# Patient Record
Sex: Female | Born: 1990 | Race: White | Hispanic: No | Marital: Single | Smoking: Former smoker
Health system: Southern US, Community
[De-identification: ages and names within clinical notes are randomized; demographics above are authoritative.]

## PROBLEM LIST (undated history)

## (undated) DIAGNOSIS — B977 Papillomavirus as the cause of diseases classified elsewhere: Secondary | ICD-10-CM

## (undated) DIAGNOSIS — F99 Mental disorder, not otherwise specified: Secondary | ICD-10-CM

## (undated) DIAGNOSIS — F1911 Other psychoactive substance abuse, in remission: Secondary | ICD-10-CM

## (undated) DIAGNOSIS — I780 Hereditary hemorrhagic telangiectasia: Secondary | ICD-10-CM

## (undated) DIAGNOSIS — R87613 High grade squamous intraepithelial lesion on cytologic smear of cervix (HGSIL): Secondary | ICD-10-CM

## (undated) DIAGNOSIS — D649 Anemia, unspecified: Secondary | ICD-10-CM

## (undated) DIAGNOSIS — T8859XA Other complications of anesthesia, initial encounter: Secondary | ICD-10-CM

## (undated) DIAGNOSIS — R040 Epistaxis: Secondary | ICD-10-CM

## (undated) DIAGNOSIS — Z8489 Family history of other specified conditions: Secondary | ICD-10-CM

## (undated) DIAGNOSIS — M4125 Other idiopathic scoliosis, thoracolumbar region: Secondary | ICD-10-CM

## (undated) DIAGNOSIS — D509 Iron deficiency anemia, unspecified: Secondary | ICD-10-CM

## (undated) DIAGNOSIS — Z8774 Personal history of (corrected) congenital malformations of heart and circulatory system: Secondary | ICD-10-CM

## (undated) DIAGNOSIS — F1010 Alcohol abuse, uncomplicated: Secondary | ICD-10-CM

## (undated) DIAGNOSIS — F4310 Post-traumatic stress disorder, unspecified: Secondary | ICD-10-CM

## (undated) DIAGNOSIS — F319 Bipolar disorder, unspecified: Secondary | ICD-10-CM

## (undated) DIAGNOSIS — Z8659 Personal history of other mental and behavioral disorders: Secondary | ICD-10-CM

## (undated) HISTORY — PX: TONSILLECTOMY: SUR1361

## (undated) HISTORY — PX: BACK SURGERY: SHX140

## (undated) HISTORY — PX: OTHER SURGICAL HISTORY: SHX169

## (undated) HISTORY — DX: Papillomavirus as the cause of diseases classified elsewhere: B97.7

## (undated) HISTORY — DX: Mental disorder, not otherwise specified: F99

---
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
## 2010-01-28 ENCOUNTER — Encounter: Payer: Self-pay | Admitting: Internal Medicine

## 2010-01-28 ENCOUNTER — Encounter: Payer: Self-pay | Admitting: Orthopaedic Surgery

## 2010-05-25 NOTE — Discharge Summary (Signed)
Wanda Anderson, Wanda Anderson                         ACCOUNT NO.:  [id number]   MEDICAL RECORD NO.:  [id number]                   PATIENT TYPE:  INP   LOCATION:  6114                                 FACILITY:  [facility]   PHYSICIAN:  Sharolyn Douglas, M.D.                     DATE OF BIRTH:  1990/01/26   DATE OF ADMISSION:  07/20/2003  DATE OF DISCHARGE:  07/25/2003                                 DISCHARGE SUMMARY   ADMISSION DIAGNOSES:  1. Idiopathic scoliosis.  2. Hereditary hemorrhagic telangiectasia syndrome, status post coil     procedure.   DISCHARGE DIAGNOSES:  1. Status post scoliosis fusion from T4 to L4.  2. Postoperative hemorrhagic anemic. It was stabilized symptomatically. It     did not require transfusion.  3. Postoperative temperature, resolved.  4. Idiopathic scoliosis.  5. Hereditary hemorrhagic telangiectasia syndrome, status post coil     procedure.   CONSULTATIONS:  Pediatric intensive care unit as well as pediatric  __________ with medical management postoperatively.   PROCEDURE:  On July 20, 2003, the patient was taken to the operating room  for a T4 to L4 posterior spinal fusion with __________ wires. This was done  by Dr. Sharolyn Douglas, assistant was [facility], PA-C. Anesthesia used was  general. One Hemovac drain was placed intraoperatively.   LABORATORY DATA:  Blood gases on July 20, 2003, on two separate occasions  showed pH of 7.461 and 7.453, PO2 504.0 and 513.0, bicarbonate 26.5 and  25.8, base excess of 3.0 and returned to normal at 2.0. Otherwise, normal.  CBC from July 18, 2003, with differential was normal with the exception of  hemoglobin of 15.2, hematocrit 44.3, MCHC 34.4. Postoperatively, CBC  continued to be monitored. She continued to have low hemoglobin and  hematocrit dropping to a low of 8.9 and 26.2 respectively. On July 23, 2003,  however she did not require any transfusion. White count was elevated on one  occasion on July 22, 2003, at 15.0, but  returned to normal the following day  at 11.2. Red cell count was also low throughout the hospital stay  postoperatively reaching levels of 3.01 on July 23, 2003.  MCHC was slightly  elevated throughout the hospital stay with a high being 34.8 on July 21, 2003. Platelet counts were low on both occasions reaching a low of 155 on  July 22, 2003. PT, INR, and PTT normal. Complete metabolic panel normal  preoperatively; postoperatively it was monitored for two days and she did  have slight hyponatremia on one occasion on July 22, 2003, at 134. Glucose  was slightly elevated at 108 and 114 respectively on July 14th and July  15th. Calcium was slightly decreased at 8.0 and 8.1. On July 18, 2003, ALT  was 232. UA from July 18, 2003, was negative. Blood type was July 18, 2003,  was type O positive, antibody screen negative.  X-rays from preoperative, two views of chest, from May 2005 showed scoliosis  and hyperinflation. On July 20, 2003, C-arm was intraoperatively for  localization. On July 25, 2003, x-rays showed thoracolumbar spine fusion  from T4 to L4.   BRIEF HISTORY:  The patient is a 20 year old female who Dr. Noel Gerold has been  following for idiopathic scoliosis. She has been having progressive problems  concerning her back with progressive increase in her curvature to the point  it was severe prior to surgery. The risks and benefits of surgery were  discussed in length by Dr. Noel Gerold with the family as well as Shanda Bumps. She was  to get medical clearance and workup by Dr. Eliot Ford, pulmonologist at [facility] prior to her surgery secondary to her other medical  issues. She was to undergo a coil procedure prior to surgery and she did  very well this and was cleared by her medical doctor for surgery.   HOSPITAL COURSE:  On July 20, 2003, the patient was admitted to the hospital  and was taken to the operating room for the above listed procedure. She  tolerated the procedure  well without any complications. She was transferred  to the recovery room in stable condition. Postoperatively, routine  orthopedic spine protocol was followed. She was initially put in the  pediatric ICU and the pediatrics ICU team was consulted to assist [place] with  all medical management. Orthopedically, DVT prophylaxis with early  mobilization of SCDs and TED hose as well as frequent foot motion and foot  problems. Prophylactic antibiotics for 48 hours. Incentive spirometer.  Physical therapy and occupational therapy were consulted to begin getting  the patient out of bed on postoperative day two. She progressed along  extremely well from an orthopedic standpoint. Her diet was held at NPO until  she began passing flatus at which time we gradually increased her diet and  liquids and on to a regular diet. She did very well with that, did not have  any difficulty or complications. By July 25, 2003, the patient had met all  orthopedic goals, she was medically stable, and ready for discharge home. On  July 25, 2003, the patient was discharged to her home with her home health  therapist as well as family assistance.   ACTIVITY:  Daily ambulation. No brace required. Back precautions, bending,  twisting, and turning. No lifting heavier than 5 pounds. To keep the  incision dry times five days, may shower as soon as noted no drainage from  incision. Dressings as needed to the incision.   DIET:  Regular.   MEDICATIONS:  Percocet, Robaxin, multivitamin, and calcium daily. Laxative  as needed.   CONDITION ON DISCHARGE:  Stable and improved.   DISPOSITION:  The patient is being discharged to her home with her family  and home health physical therapy.      Verlin Fester, P.A.                       Sharolyn Douglas, M.D.    CM/MEDQ  D:  08/24/2003  T:  08/24/2003  Job:  [id number]

## 2010-05-25 NOTE — H&P (Signed)
Wanda Anderson, Wanda Anderson                         ACCOUNT NO.:  [id number]   MEDICAL RECORD NO.:  [id number]                   PATIENT TYPE:  INP   LOCATION:                                       FACILITY:  [facility]   PHYSICIAN:  Wanda Anderson, M.D.                     DATE OF BIRTH:  10/31/1990   DATE OF ADMISSION:  07/20/2003  DATE OF DISCHARGE:                                HISTORY & PHYSICAL   CHIEF COMPLAINT:  Back pain.   HISTORY OF PRESENT ILLNESS:  This 20 year old young lady has been seen by  Dr. Noel Anderson for continuing progressive problems concerning pain in her back.  She also is quite aware of and cognizant of the deformity.  She has severe  idiopathic scoliosis.  She has progressed now to the point where surgery is  indicated.  She has elevation of her right shoulder.  The C7 plumb line  falls 1- to -2-cm to the left of the gluteal cleft.  She has very poor  motion of the thoracolumbar spine.  Her right thoracic rotational prominence  is about 12 degrees, thoracolumbar prominence is 20 degrees.  Full length AP  radiograph shows a 71 degree right thoracic curve from T6-T11, thoracolumbar  curve is 60 degrees from T11-L4.  The situation was reviewed with the family  by Dr. Noel Anderson.  The risks of surgery were also discussed.  They have agreed  to the conditions and will proceed with surgery.  It was noted the patient  had a possible hereditary hemorrhagic telangiectasia.  She had been worked  up by Dr. Quillian Anderson a pulmonologist at [facility].  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  She underwent a coil procedure.  According to  her mother, she did very well with this.  She has essentially been cleared  for this surgery.   OTHER PAST MEDICAL HISTORY:  The patient has had no previous surgeries,  other than tonsillectomy in 2000.  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   CURRENT MEDICATIONS:   Adderall XR 25 mg one q.a.m.   ALLERGIES:  1. AMOXICILLIN which causes a rash.  2. ASPIRIN causes nosebleeds.  However, penicillin and Keflex are okay as an oral antibiotic.   FAMILY HISTORY:  Positive for hypertension in a grandfather and diabetes in  a grandmother.  Mother with a stroke at 38-years-of-age.   SOCIAL HISTORY:  No intake of alcohol or tobacco products.  Caregiver after  surgery will be her father and stepmother.   REVIEW OF SYSTEMS:  CNS:  No seizure disorder, paralysis, numbness, double  vision.  RESPIRATORY:  No productive cough.  No hemoptysis.  No shortness of  breath.  CARDIOVASCULAR:  No chest pain.  No angina.  No orthopnea.  GASTROINTESTINAL:  No nausea, vomiting, melena, or  bloody stools.  GENITOURINARY:  No discharge, dysuria, hematuria.  MUSCULOSKELETAL:  Primarily in the present illness.   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  Alert, and cooperative, friendly, 20 year old white female  who is accompanied by her mother.  VITAL SIGNS:  Blood pressure 98/62, pulse 80, respirations 12.  HEENT:  Normocephalic.  PERRLA.  EOM intact.  Oropharynx is clear.  CHEST:  Clear to auscultation.  No rhonchi, no rales.  HEART:  Regular rate and rhythm.  No murmurs are heard.  ABDOMEN:  Soft, nontender.  Liver and spleen not felt.  GENITALIA/RECTAL/PELVIC/BREAST:  Not done.  Not pertinent to present  illness.  EXTREMITIES:  No deformities.  BACK:  Examination as in present illness above.   ADMITTING DIAGNOSES:  1. Idiopathic scoliosis, progressive.  2. Hereditary hemorrhagic telangiectasia syndrome, status post coil     procedure.   PLAN:  The patient will undergo thoracic and lumbar fusion with hooks,  screws, wires, and posterior iliac crest bone graft.      Wanda Anderson.                 Wanda Anderson, M.D.    DLU/MEDQ  D:  07/19/2003  T:  07/19/2003  Job:  [id number]   cc:   [facility]  [place], [place]

## 2010-05-25 NOTE — Op Note (Signed)
[name], Wanda Anderson                         ACCOUNT NO.:  [id number]   MEDICAL RECORD NO.:  [id number]                   PATIENT TYPE:  INP   LOCATION:  6155                                 FACILITY:  [facility]   PHYSICIAN:  Sharolyn Douglas, M.D.                     DATE OF BIRTH:  [birth year]   DATE OF PROCEDURE:  07/20/2003  DATE OF DISCHARGE:                                 OPERATIVE REPORT   PREOPERATIVE DIAGNOSIS:  Idiopathic thoracolumbar scoliosis.   PROCEDURE:  1. Posterior spinal arthrodesis T4 through L4.  2. Segmental spinal instrumentation T4 through L4 utilizing hooks, wires,     and screws with the final concept stainless steel Encompass system.  3. Right posterior iliac crest bone graft taken through a separate fascial     incision.  4. Local autogenous bone graft supplemented with 40 mL allograft chips.  5. Thoracic laminotomies at T5-6, T6-7, T7-8, T8-9, and T9-10 for placement     of sublaminar wires.  6. Neuro monitoring with SSEP's motor evoked potentials and triggered EMG's.   SURGEON:  Sharolyn Douglas, M.D.   ASSISTANT:  Verlin Fester, P.A.   ANESTHESIA:  General endotracheal.   COMPLICATIONS:  None.   INDICATIONS FOR PROCEDURE:  The patient is a very pleasant 20 year old girl  with a history of HHT syndrome and severe progressive idiopathic scoliosis.  Preoperative curve measured 71 degrees to the right from T6 to T11, 60  degree structural lumbar curve from T11 to L4.  The thoracic curve bends  down to 48 degrees on sidebending radiographs.  Lumbar curve bends to 26  degrees.  She is decompensated 2 to 3 cm to the right.  Her right shoulder  is elevated.  After carefully reviewing the risks and benefits of surgery  with her and her family, they have elected to undergo posterior spinal  fusion with instrumentation to partially correct her deformity and prevent  further progression.  They are aware of the risks including bleeding,  infection, nerve root injury, dural  tear, paralysis, hardware failure,  pseudoarthrosis, adjacent segment degeneration, decompensation, and the  possible need for additional surgery.  She has had preoperative clearance  from the pediatric pulmonologist.   DESCRIPTION OF PROCEDURE:  The patient was properly identified in the  holding area and taken to the operating room.  She underwent general  endotracheal anesthesia without difficulty, given prophylactic IV  antibiotics.  She was carefully positioned on the Acromed 4 posterior  positioning frame prone.  All bone prominences were padded.  Face and eyes  were protected at all times.  Back was prepped and draped in the usual  sterile fashion.  Neuro monitoring was established in the form of SSEP's  motor evoked potentials and EMG's.  A midline incision was made from T4 down  to L5.  Dissection was carried down to the deep fascia.  The subcutaneous  tissue  was then elevated on the right side out over the posterior superior  iliac spine.  The fascia directly over the PSIS was incised and the  cartilaginous cap was elevated off of the underlying bone on the outer table  of the iliac wing on the right side.  Osteotomes were used to remove the  outer table of cortical bone.  Copious amounts of cancellous bone was then  removed from within the iliac crest using curets and gouges.  The wound was  then irrigated.  Gelfoam was left over the iliac crest to help with  bleeding.  The cartilaginous cap was then reapproximated using #1 Vicryl  suture.  This completed the right posterior iliac crest bone graft.  We then  turned our attention to exposing the thoracolumbar spine from T4 down to L4.  Using subperiosteal dissection the transverse processes and facet joints at  each level were exposed.  Deep retractors were placed.  Each facet joint was  decorticated from T4 down through L3-4 inferiorly.  The spinous processes  were removed and cleaned of all soft tissue.  They were then run  through a  bone mill and used for later autografting.  We then placed downgoing  transverse process hooks at T4 bilaterally.  Pedicle hook placed on the left  at T4 and on the right at T5.  Periapical transverse process claw was placed  at T8 and T9 on the right side.  We then turned our attention to placing  pedicle screws on the left side at L4.  We used an anatomic probing  technique.  Each pedicle starting point was identified using surface  landmarks and fluoroscopy.  The pedicle was initiated with the awl.  The  pedicle was then cannulated using Steffee pedicle probe.  Balltip probe used  to palpate pedicle confirming there were no breeches.  The pedicle was then  tapped.  A 6.5 x 35 mm screw was placed on the left at L4. We had excellent  purchase.  Using a similar technique, a 6.5 x 35 mm screw placed on the  right at L4.  At L3, a 5.5 x 30 mm screw placed on the right side.  The left  L3 pedicle was found to be sclerotic.  We therefore placed a downgoing  lamina hook on L2.  The pedicle screws were then stimulated using triggered  EMG's and we had a response at greater than 10 milliamps consistent with  intraosseous placement of the screws.  We then turned our attention to  performing laminotomies at T7-8, T9-10, and T11-12 using Leksell rongeur and  Kerrison punches.  The ligamentum flavum was elevated.  Care was taken to  protect the underlying dura.  Bleeding was controlled with bipolar  electrocautery and Gelfoam.  We then placed sublaminar wires under the  lamina of T6, T7, T8, and T9.  Under the T6-7, T7-8, T8-9, T9-10, and T11-  12.  Leksell rongeur used to remove the intervening spinous process and  lamina.  Kerrison punches used to elevate the ligamentum flavum.  Care was  taken to protect the underlying epidural veins and spinal cord.  Bleeding  was controlled with bipolar electrocautery and Gelfoam.  We then carefully placed sublaminar wires under T7, T8, T9, and T10.   Great care was taken to  prevent any downward pressure into the spinal canal.  We monitored SSEP's  and motor evoked potentials throughout the procedure and there were no  deleterious changes.   We then turned  our attention to decorticating the transverse processes from  T4 through L4 bilaterally using high speed bur.  The posterior iliac crest  bone graft as well as the local autogenous bone graft from the spinous  processes was carefully packed into the facet joints and between the  transverse processes.  We then cut the stainless steel rods to the  appropriate length and bent the rods into physiologic kyphosis and lordosis.  Starting on the left side, the rod was attached to the proximal claw.  The  distal portion of the rod was seated into the pedicle screws and hooked  distally.  The rod was rotated into the sagittal plane.  The sublaminar  wires were sequentially tightened reducing the thoracic curve.  We found the  scoliosis to be very stiff and only a limited correction was achieved.  The  proximal claw construct on the left was compressed.  The right rod was then  placed into the hook claw construct proximally and attached to the pedicle  screws distally.  The intervening periapical claw was also fixed to the rod.  The proximal claw construct on the right was compressed, partially reducing  the elevated right shoulder.  The periapical claw was compressed.  Distally  on the left side compression was applied distally and distraction on the  right side to level the caudal vertebrae.  The wound was irrigated.  Gelfoam  was left over the laminotomies.  40 mL of cancellous chips then packed over  the transverse processes and lamina.  Two 30 mm cross connectors placed, one  proximally and one distally.  The locking caps were sheared off.  Intraoperative x-rays showed appropriate positioning of the pedicle screws  with the distal portion of the instrumentation ending at L4.  The wound  was  then closed in layers, #1 Vicryl on the deep fascia, 2-0 Vicryl on the  subcutaneous layer, superficial Hemovac drain left in place.  The skin was  approximated using a running 3-0 subcuticular Vicryl suture.  Benzoin and  Steri-Strips were placed.  A sterile dressing was applied.  The patient was  turned supine and extubated without difficulty and transferred to the  recovery room in stable condition able to move her upper and lower  extremities.  It should be noted that there were no deleterious changes in  the neuro monitoring throughout the procedure.                                               Sharolyn Douglas, M.D.    MC/MEDQ  D:  07/20/2003  T:  07/21/2003  Job:  [id number]

## 2019-10-13 ENCOUNTER — Encounter (RURAL_HEALTH_CENTER): Payer: Self-pay | Admitting: Obstetrics & Gynecology

## 2019-10-13 ENCOUNTER — Ambulatory Visit: Payer: Medicaid Other | Attending: Obstetrics & Gynecology | Admitting: Obstetrics & Gynecology

## 2019-10-13 VITALS — BP 116/74 | Ht 65.0 in | Wt 123.0 lb

## 2019-10-13 DIAGNOSIS — N926 Irregular menstruation, unspecified: Secondary | ICD-10-CM | POA: Insufficient documentation

## 2019-10-13 LAB — POCT PREGNANCY TEST, URINE HCG: POCT Pregnancy HCG Test, UR: NEGATIVE

## 2019-10-13 NOTE — Progress Notes (Signed)
[facility]        Date: 10/13/2019  Patient Name: Jeanette Terrell  Attending Physician: No att. providers found    CC: missed period    History of Present Illness:   [name] is a 29 y.o. female presents for missed period.  She reports no menses in September but started menses on October 2 and is still concerned about pregnancy.  She reports moving for a few times in the last month and right before bleeding started had a sharp abdominal pain x 1.  Pain is no longer present.  She reports she has never missed a period in her life unless she was pregnant.  She and her partner are okay if she becomes pregnant and her not using anything for birth control.  She does not desire to start birth control at this time.  She is currently taking a prenatal vitamin.    OB/ GYN History:     Menstrual History:  OB History     Gravida   3    Para   1    Term   1    Preterm        AB   2    Living           SAB   2    TAB        Ectopic        Multiple        Live Births                    Patient's last menstrual period was 10/09/2019.     Denies STD  Medical History:     Past Medical History:   Diagnosis Date    Human papilloma virus infection     Mental disorder     depression, bi-polar, anxiety, PTSD     Medications:     Patient's Medications    No medications on file     Past Surgical History:     Past Surgical History:   Procedure Laterality Date    BACK SURGERY      TONSILLECTOMY       Allergies:     Allergies   Allergen Reactions    Amoxicillin Rash     Family History:     Family History   Problem Relation Age of Onset    Diabetes Maternal Grandmother     Breast cancer Maternal Grandmother     Stroke Father      Social History:     Social History     Socioeconomic History    Marital status: Single     Spouse name: Not on file    Number of children: Not on file    Years of education: Not on file    Highest education level: Not on file   Occupational History    Not on file   Tobacco Use    Smoking status:  Former Smoker    Smokeless tobacco: Never Used   Substance and Sexual Activity    Alcohol use: Yes    Drug use: Not Currently    Sexual activity: Yes     Partners: Male     Birth control/protection: None   Other Topics Concern    Not on file   Social History Narrative    Not on file     Social Determinants of Health     Financial Resource Strain:     Difficulty of Paying Living Expenses:  Food Insecurity:     Worried About Programme researcher, broadcasting/film/video in the Last Year:     Barista in the Last Year:    Transportation Needs:     Freight forwarder (Medical):     Lack of Transportation (Non-Medical):    Physical Activity:     Days of Exercise per Week:     Minutes of Exercise per Session:    Stress:     Feeling of Stress :    Social Connections:     Frequency of Communication with Friends and Family:     Frequency of Social Gatherings with Friends and Family:     Attends Religious Services:     Active Member of Clubs or Organizations:     Attends Engineer, structural:     Marital Status:    Intimate Partner Violence:     Fear of Current or Ex-Partner:     Emotionally Abused:     Physically Abused:     Sexually Abused:      Review of Systems:   Denies Headaches/ visual problems/ chest pain/ SOB/Painful urination/ Unexplained weight loss/ Skin changes/ fevers    Physical Exam:     Vitals:    10/13/19 1009   BP: 116/74   Body mass index is 20.47 kg/m.    GEN- normal appearing female  SKIN- No lesions/ masses noted, normal skin turgor  NEURO:  Normal affect , A+Ox3  Psych:  Normal affect and mood   HEENT: Normocephalic/ EOMI  Ext:  Full range of motion x4, Normal gait    Assessment:   Missed period    Plan:   Discussed negative in office urine pregnancy test.  Offered blood work to confirm if anxious, she declines.  Advised to continue prenatal vitamin since she is not using birth control.  Discussed tracking periods and bleeding pattern, if continues to be abnormal follow-up for  further evaluation.        Signed by: Danae Orleans, DO  10/13/2019

## 2020-01-08 NOTE — L&D Delivery Note (Signed)
Delivery Note Pt pushed x 20 minutes. At 1:44 PM a viable and healthy female was delivered via Vaginal, Spontaneous (Presentation: Right Occiput Anterior).  APGAR: 6, 7; weight 6 lb 7 oz (2920 g).  TXA, Pitocin and methergine given immediately after delivery to minimize blood loss since pt's Hgb on admission was 7.4.  Placenta status: Spontaneous, Intact.  Cord: 3 vessels with the following complications: None.  Cord pH: NA. Baby placed skin-to-skin w/ mom. Baby appeared to be transitioning well initially. Delayed cord clamping x 1 minute. Cord clamped x 2 and cut by [name]. Baby then noted to be grunting and was taken to warmer for eval by NICU team See note. To NICU.   Anesthesia: Epidural Episiotomy: None Lacerations: Small 2nd degree Suture Repair: 3.0 vicryl rapide Est. Blood Loss (mL): 150  Mom to postpartum.  Baby to NICU. Placenta to: L&D Feeding: Br/Bo Circ: No Contraception: OP BTL. Declines bridge contraception.  Methergine series x 24 hours. CBC in am Venofer 500 mg IV x 1. Recommend F/U w/ OB RE: additional doses PP.   Dorathy Kinsman 06/11/2020, 3:10 PM

## 2020-03-16 DIAGNOSIS — Z9889 Other specified postprocedural states: Secondary | ICD-10-CM | POA: Diagnosis not present

## 2020-03-16 DIAGNOSIS — O2342 Unspecified infection of urinary tract in pregnancy, second trimester: Secondary | ICD-10-CM | POA: Diagnosis not present

## 2020-03-16 DIAGNOSIS — O99342 Other mental disorders complicating pregnancy, second trimester: Secondary | ICD-10-CM | POA: Diagnosis not present

## 2020-03-16 DIAGNOSIS — Z8659 Personal history of other mental and behavioral disorders: Secondary | ICD-10-CM | POA: Diagnosis not present

## 2020-03-16 DIAGNOSIS — I780 Hereditary hemorrhagic telangiectasia: Secondary | ICD-10-CM | POA: Diagnosis not present

## 2020-03-16 DIAGNOSIS — Z3A22 22 weeks gestation of pregnancy: Secondary | ICD-10-CM | POA: Diagnosis not present

## 2020-03-16 DIAGNOSIS — F3111 Bipolar disorder, current episode manic without psychotic features, mild: Secondary | ICD-10-CM | POA: Diagnosis not present

## 2020-03-16 DIAGNOSIS — F319 Bipolar disorder, unspecified: Secondary | ICD-10-CM | POA: Diagnosis not present

## 2020-03-16 DIAGNOSIS — O99412 Diseases of the circulatory system complicating pregnancy, second trimester: Secondary | ICD-10-CM | POA: Diagnosis not present

## 2020-03-16 DIAGNOSIS — O0932 Supervision of pregnancy with insufficient antenatal care, second trimester: Secondary | ICD-10-CM | POA: Diagnosis not present

## 2020-03-30 DIAGNOSIS — O0932 Supervision of pregnancy with insufficient antenatal care, second trimester: Secondary | ICD-10-CM | POA: Diagnosis not present

## 2020-03-30 DIAGNOSIS — Z9889 Other specified postprocedural states: Secondary | ICD-10-CM | POA: Diagnosis not present

## 2020-03-30 DIAGNOSIS — O99342 Other mental disorders complicating pregnancy, second trimester: Secondary | ICD-10-CM | POA: Diagnosis not present

## 2020-03-30 DIAGNOSIS — O99412 Diseases of the circulatory system complicating pregnancy, second trimester: Secondary | ICD-10-CM | POA: Diagnosis not present

## 2020-03-30 DIAGNOSIS — N390 Urinary tract infection, site not specified: Secondary | ICD-10-CM | POA: Diagnosis not present

## 2020-03-30 DIAGNOSIS — O2342 Unspecified infection of urinary tract in pregnancy, second trimester: Secondary | ICD-10-CM | POA: Diagnosis not present

## 2020-03-30 DIAGNOSIS — I780 Hereditary hemorrhagic telangiectasia: Secondary | ICD-10-CM | POA: Diagnosis not present

## 2020-03-30 DIAGNOSIS — D508 Other iron deficiency anemias: Secondary | ICD-10-CM | POA: Diagnosis not present

## 2020-03-30 DIAGNOSIS — Z8659 Personal history of other mental and behavioral disorders: Secondary | ICD-10-CM | POA: Diagnosis not present

## 2020-03-30 DIAGNOSIS — O26892 Other specified pregnancy related conditions, second trimester: Secondary | ICD-10-CM | POA: Diagnosis not present

## 2020-03-30 DIAGNOSIS — F3111 Bipolar disorder, current episode manic without psychotic features, mild: Secondary | ICD-10-CM | POA: Diagnosis not present

## 2020-03-30 DIAGNOSIS — Z8759 Personal history of other complications of pregnancy, childbirth and the puerperium: Secondary | ICD-10-CM | POA: Diagnosis not present

## 2020-03-30 DIAGNOSIS — F319 Bipolar disorder, unspecified: Secondary | ICD-10-CM | POA: Diagnosis not present

## 2020-03-30 DIAGNOSIS — D509 Iron deficiency anemia, unspecified: Secondary | ICD-10-CM | POA: Diagnosis not present

## 2020-03-30 DIAGNOSIS — M549 Dorsalgia, unspecified: Secondary | ICD-10-CM | POA: Diagnosis not present

## 2020-03-30 DIAGNOSIS — Z87898 Personal history of other specified conditions: Secondary | ICD-10-CM | POA: Diagnosis not present

## 2020-03-30 DIAGNOSIS — Z3A24 24 weeks gestation of pregnancy: Secondary | ICD-10-CM | POA: Diagnosis not present

## 2020-03-30 DIAGNOSIS — O99012 Anemia complicating pregnancy, second trimester: Secondary | ICD-10-CM | POA: Diagnosis not present

## 2020-03-30 DIAGNOSIS — O09292 Supervision of pregnancy with other poor reproductive or obstetric history, second trimester: Secondary | ICD-10-CM | POA: Diagnosis not present

## 2020-05-24 DIAGNOSIS — N898 Other specified noninflammatory disorders of vagina: Secondary | ICD-10-CM | POA: Diagnosis not present

## 2020-05-24 DIAGNOSIS — M549 Dorsalgia, unspecified: Secondary | ICD-10-CM | POA: Diagnosis not present

## 2020-05-24 DIAGNOSIS — Z880 Allergy status to penicillin: Secondary | ICD-10-CM | POA: Diagnosis not present

## 2020-05-24 DIAGNOSIS — O26893 Other specified pregnancy related conditions, third trimester: Secondary | ICD-10-CM | POA: Diagnosis not present

## 2020-05-24 DIAGNOSIS — O4703 False labor before 37 completed weeks of gestation, third trimester: Secondary | ICD-10-CM | POA: Diagnosis not present

## 2020-05-24 DIAGNOSIS — R109 Unspecified abdominal pain: Secondary | ICD-10-CM | POA: Diagnosis not present

## 2020-05-24 DIAGNOSIS — Z3A32 32 weeks gestation of pregnancy: Secondary | ICD-10-CM | POA: Diagnosis not present

## 2020-05-30 DIAGNOSIS — O0933 Supervision of pregnancy with insufficient antenatal care, third trimester: Secondary | ICD-10-CM | POA: Diagnosis not present

## 2020-05-30 DIAGNOSIS — F3111 Bipolar disorder, current episode manic without psychotic features, mild: Secondary | ICD-10-CM | POA: Diagnosis not present

## 2020-05-30 DIAGNOSIS — Z8659 Personal history of other mental and behavioral disorders: Secondary | ICD-10-CM | POA: Diagnosis not present

## 2020-05-30 DIAGNOSIS — N898 Other specified noninflammatory disorders of vagina: Secondary | ICD-10-CM | POA: Diagnosis not present

## 2020-05-30 DIAGNOSIS — D6489 Other specified anemias: Secondary | ICD-10-CM | POA: Diagnosis not present

## 2020-05-30 DIAGNOSIS — O99323 Drug use complicating pregnancy, third trimester: Secondary | ICD-10-CM | POA: Diagnosis not present

## 2020-05-30 DIAGNOSIS — Z8739 Personal history of other diseases of the musculoskeletal system and connective tissue: Secondary | ICD-10-CM | POA: Diagnosis not present

## 2020-05-30 DIAGNOSIS — Z36 Encounter for antenatal screening for chromosomal anomalies: Secondary | ICD-10-CM | POA: Diagnosis not present

## 2020-05-30 DIAGNOSIS — I780 Hereditary hemorrhagic telangiectasia: Secondary | ICD-10-CM | POA: Diagnosis not present

## 2020-05-30 DIAGNOSIS — O99413 Diseases of the circulatory system complicating pregnancy, third trimester: Secondary | ICD-10-CM | POA: Diagnosis not present

## 2020-05-30 DIAGNOSIS — O99343 Other mental disorders complicating pregnancy, third trimester: Secondary | ICD-10-CM | POA: Diagnosis not present

## 2020-05-30 DIAGNOSIS — Z3A35 35 weeks gestation of pregnancy: Secondary | ICD-10-CM | POA: Diagnosis not present

## 2020-05-30 DIAGNOSIS — F1911 Other psychoactive substance abuse, in remission: Secondary | ICD-10-CM | POA: Diagnosis not present

## 2020-05-30 DIAGNOSIS — Z363 Encounter for antenatal screening for malformations: Secondary | ICD-10-CM | POA: Diagnosis not present

## 2020-05-30 DIAGNOSIS — O99891 Other specified diseases and conditions complicating pregnancy: Secondary | ICD-10-CM | POA: Diagnosis not present

## 2020-05-30 DIAGNOSIS — O99013 Anemia complicating pregnancy, third trimester: Secondary | ICD-10-CM | POA: Diagnosis not present

## 2020-06-08 ENCOUNTER — Inpatient Hospital Stay (HOSPITAL_COMMUNITY)
Admission: AD | Admit: 2020-06-08 | Discharge: 2020-06-09 | Disposition: A | Discharge status: Home / Self Care | Payer: Medicaid Other | Source: Home / Self Care | Attending: Obstetrics & Gynecology | Admitting: Obstetrics & Gynecology

## 2020-06-08 ENCOUNTER — Other Ambulatory Visit: Payer: Self-pay

## 2020-06-08 ENCOUNTER — Encounter (HOSPITAL_COMMUNITY): Payer: Self-pay | Admitting: Obstetrics & Gynecology

## 2020-06-08 DIAGNOSIS — D509 Iron deficiency anemia, unspecified: Secondary | ICD-10-CM | POA: Diagnosis not present

## 2020-06-08 DIAGNOSIS — O479 False labor, unspecified: Secondary | ICD-10-CM | POA: Insufficient documentation

## 2020-06-08 DIAGNOSIS — Z3A00 Weeks of gestation of pregnancy not specified: Secondary | ICD-10-CM | POA: Insufficient documentation

## 2020-06-08 DIAGNOSIS — Z3A37 37 weeks gestation of pregnancy: Secondary | ICD-10-CM | POA: Diagnosis not present

## 2020-06-08 DIAGNOSIS — O99013 Anemia complicating pregnancy, third trimester: Secondary | ICD-10-CM | POA: Diagnosis not present

## 2020-06-08 HISTORY — DX: Hereditary hemorrhagic telangiectasia: I78.0

## 2020-06-08 NOTE — MAU Note (Signed)
Pt report she had an Iron infusion today. After she went home started having ctx on and off.  About 10 min aprt. Denies any vag bleeding or leaking of fluid. Reports her mucus plug has come out.

## 2020-06-09 ENCOUNTER — Encounter (HOSPITAL_COMMUNITY): Payer: Self-pay | Admitting: Obstetrics & Gynecology

## 2020-06-09 MED ORDER — NALBUPHINE HCL 10 MG/ML IJ SOLN
10.0000 mg | Freq: Once | INTRAMUSCULAR | Status: AC
  Administered 2020-06-09: 10 mg via INTRAMUSCULAR
  Filled 2020-06-09: qty 1

## 2020-06-09 NOTE — MAU Note (Signed)
Entered pt room for discharge to find pt tearful and c/o 10/10 lower abdominal and back pain.  Cervix rechecked and no significant change was noted.  MD made aware, see new med orders.

## 2020-06-10 ENCOUNTER — Inpatient Hospital Stay (HOSPITAL_COMMUNITY)
Admission: AD | Admit: 2020-06-10 | Discharge: 2020-06-13 | DRG: 807 | Disposition: A | Discharge status: Home / Self Care | Payer: Medicaid Other | Attending: Obstetrics and Gynecology | Admitting: Obstetrics and Gynecology

## 2020-06-10 ENCOUNTER — Encounter (HOSPITAL_COMMUNITY): Payer: Self-pay | Admitting: Obstetrics and Gynecology

## 2020-06-10 DIAGNOSIS — O9902 Anemia complicating childbirth: Secondary | ICD-10-CM | POA: Diagnosis not present

## 2020-06-10 DIAGNOSIS — O26893 Other specified pregnancy related conditions, third trimester: Secondary | ICD-10-CM | POA: Diagnosis not present

## 2020-06-10 DIAGNOSIS — Z87891 Personal history of nicotine dependence: Secondary | ICD-10-CM | POA: Diagnosis not present

## 2020-06-10 DIAGNOSIS — Z20822 Contact with and (suspected) exposure to covid-19: Secondary | ICD-10-CM | POA: Diagnosis present

## 2020-06-10 DIAGNOSIS — D649 Anemia, unspecified: Secondary | ICD-10-CM | POA: Diagnosis not present

## 2020-06-10 DIAGNOSIS — Z3A37 37 weeks gestation of pregnancy: Secondary | ICD-10-CM | POA: Diagnosis not present

## 2020-06-10 HISTORY — DX: Other complications of anesthesia, initial encounter: T88.59XA

## 2020-06-10 HISTORY — DX: Anemia, unspecified: D64.9

## 2020-06-10 LAB — CBC
HCT: 25.2 % — ABNORMAL LOW (ref 36.0–46.0)
Hemoglobin: 7.4 g/dL — ABNORMAL LOW (ref 12.0–15.0)
MCH: 21 pg — ABNORMAL LOW (ref 26.0–34.0)
MCHC: 29.4 g/dL — ABNORMAL LOW (ref 30.0–36.0)
MCV: 71.6 fL — ABNORMAL LOW (ref 80.0–100.0)
Platelets: 283 10*3/uL (ref 150–400)
RBC: 3.52 MIL/uL — ABNORMAL LOW (ref 3.87–5.11)
RDW: 16.4 % — ABNORMAL HIGH (ref 11.5–15.5)
WBC: 16.1 10*3/uL — ABNORMAL HIGH (ref 4.0–10.5)
nRBC: 0.2 % (ref 0.0–0.2)

## 2020-06-10 MED ORDER — LACTATED RINGERS IV SOLN
500.0000 mL | INTRAVENOUS | Status: DC | PRN
Start: 2020-06-10 — End: 2020-06-11
  Administered 2020-06-11 (×3): 500 mL via INTRAVENOUS

## 2020-06-10 NOTE — H&P (Addendum)
LABOR AND DELIVERY ADMISSION HISTORY AND PHYSICAL NOTE  Wanda Anderson is a 30 y.o. female [phone] with IUP at [redacted]w[redacted]d by Unknown presenting for spontaneous onset of labor. Her prenatal care was at [facility]), but none of her records are available at this time bc she just recently decided to transfer care. She had an iron infusion on 6/2 and after going home started having contractions every 10 minutes. Did not have any bleeding, LOF at that time. Her cervical check was 3cm and unchanged on recheck, so she was sent home. This evening she began experiencing painful contractions at 8:30pm and returned to the MAU where she was found to be 4cm dilated, progressed to 5cm an hour later so she was admitted to L&D.  She reports positive fetal movement. She denies leakage of fluid, vaginal bleeding, experiencing contractions every 3-4 minutes.  She plans on breast and bottle feeding. Her contraception plan is: BTL outpatient, reports she signed papers 1 week ago and her partner has them with him.  Prenatal History/Complications: PNC at [facility]: unknown  Pregnancy complications (per patient report - no records)  - Anemia - Hgb 7.4 on admission - HHT - tobacco use until [redacted]wks gestation - hx spinal surgery (rods in place) - remote hx IVDU (sober x6 years)   Past Medical History: Past Medical History:  Diagnosis Date  . 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HCC)   . 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HCC)     Past Surgical History: Past Surgical History:  Procedure Laterality Date  . BACK SURGERY     rods in the back   . TONSILLECTOMY      Obstetrical History: OB History    Gravida  4   Para  1   Term  1   Preterm      AB  2   Living  1     SAB  2   IAB      Ectopic      Multiple      Live Births  1           Social History: Social History   Socioeconomic History  . Marital status: Single    Spouse name: Not on file  .  Number of children: Not on file  . Years of education: Not on file  . Highest education level: Not on file  Occupational History  . Not on file  Tobacco Use  . Smoking status: Former Smoker    Types: E-cigarettes  . Smokeless tobacco: Never Used  . Tobacco comment: quit once found out pregnant   Vaping Use  . Vaping Use: Former  Substance and Sexual Activity  . Alcohol use: Not Currently  . Drug use: Not Currently  . Sexual activity: Yes  Other Topics Concern  . Not on file  Social History Narrative  . Not on file   Social Determinants of Health   Financial Resource Strain: Not on file  Food Insecurity: Not on file  Transportation Needs: Not on file  Physical Activity: Not on file  Stress: Not on file  Social Connections: Not on file    Family History: History reviewed. No pertinent family history.  Allergies: Allergies  Allergen Reactions  . Latex Hives and Itching    No medications prior to admission.     Review of Systems  All systems reviewed and negative except as stated in HPI  Physical Exam Blood pressure (!) 104/57, pulse 92, temperature 98.4 F (36.9 C),  temperature source Oral, resp. rate 18, SpO2 100 %. General appearance: alert, oriented, moderate distress during contractions Lungs: normal respiratory effort Heart: regular rate Abdomen: soft, non-tender; gravid Extremities: No calf swelling or tenderness Presentation: cephalic by exam in MAU  Fetal monitoring: Baseline: 145 bpm, Variability: Good {> 6 bpm), Accelerations: Reactive and Decelerations: Absent Uterine activity: Frequency: Every 2-3 minutes  Dilation: 5 Effacement (%): 80 Station: -2 Exam by:: J.Bellamy, RN  Prenatal labs: ABO, Rh:  unknown, Type and Screen ordered Antibody:  unknown Rubella:  unknown RPR:   unknown HBsAg:   unknown HIV:   unknown GC/Chlamydia: unknown GBS:   unknown 2-hr GTT: unknown Genetic screening:  unknown Anatomy US: unknown  Prenatal  Transfer Tool  Maternal Diabetes: No, per pt Genetic Screening: unknown Maternal Ultrasounds/Referrals: unknown Fetal Ultrasounds or other Referrals:  unknown Maternal Substance Abuse:  Hx of heroine, meth, 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 None during this pregnancy. No alcohol. Was smoking cigs until [redacted]wk gestation this pregnancy Significant Maternal Medications:  None Significant Maternal Lab Results: GBS unknown, Hgb 7.4  No results found for this or any previous visit (from the past 24 hour(s)).  There are no problems to display for this patient.   Assessment: Wanda Anderson is a 30 y.o. G4P1021 at [redacted]w[redacted]d here for SOL  #Labor: Progressing normally. AROM'd at 0200. Will augment as needed. Will need to obtain prenatal records from [facility] when able. Will check T&S, HIV, HBsAG, RPR. #Anemia: was not able to tolerate PO iron. Received iron transfusion 6/2. Hgb 7.2 on admission. Type and cross 2 units. TXA ordered for delivery. #Fetal Wellbeing:  Category I #Pain Control: per anesthesia, not a candidate for epidural  #GBS/ID: unknown, at term and no risk factors so ppx not necessary #COVID: swab pending #MOF: breast and bottle #MOC: BTL outpatient (papers signed 5/24) #Circ: no #Anticipated MOD: NSVD   Franchot Erichsen, MD, PGY-1 Family Medicine Resident, [facility] Faculty Teaching Service  06/10/2020, 11:13 PM   I saw and evaluated the patient. I agree with the findings and the plan of care as documented in the resident's note. I have made any necessary edits above.  Casper Harrison, MD [facility] Family Medicine Fellow, [facility] for [facility], [facility]

## 2020-06-11 ENCOUNTER — Inpatient Hospital Stay (HOSPITAL_COMMUNITY): Payer: Medicaid Other | Admitting: Anesthesiology

## 2020-06-11 ENCOUNTER — Encounter (HOSPITAL_COMMUNITY): Payer: Self-pay | Admitting: Obstetrics and Gynecology

## 2020-06-11 ENCOUNTER — Other Ambulatory Visit: Payer: Self-pay

## 2020-06-11 DIAGNOSIS — O9902 Anemia complicating childbirth: Secondary | ICD-10-CM | POA: Diagnosis not present

## 2020-06-11 DIAGNOSIS — D649 Anemia, unspecified: Secondary | ICD-10-CM | POA: Diagnosis not present

## 2020-06-11 DIAGNOSIS — Z3A37 37 weeks gestation of pregnancy: Secondary | ICD-10-CM | POA: Diagnosis not present

## 2020-06-11 LAB — RAPID URINE DRUG SCREEN, HOSP PERFORMED
Amphetamines: NOT DETECTED
Barbiturates: NOT DETECTED
Benzodiazepines: NOT DETECTED
Cocaine: NOT DETECTED
Opiates: NOT DETECTED
Tetrahydrocannabinol: NOT DETECTED

## 2020-06-11 LAB — ABO/RH: ABO/RH(D): O POS

## 2020-06-11 LAB — PREPARE RBC (CROSSMATCH)

## 2020-06-11 LAB — RESP PANEL BY RT-PCR (FLU A&B, COVID) ARPGX2
Influenza A by PCR: NEGATIVE
Influenza B by PCR: NEGATIVE
SARS Coronavirus 2 by RT PCR: NEGATIVE

## 2020-06-11 LAB — HEPATITIS B SURFACE ANTIGEN: Hepatitis B Surface Ag: NONREACTIVE

## 2020-06-11 LAB — HIV ANTIBODY (ROUTINE TESTING W REFLEX): HIV Screen 4th Generation wRfx: NONREACTIVE

## 2020-06-11 LAB — RPR: RPR Ser Ql: NONREACTIVE

## 2020-06-11 MED ORDER — TRANEXAMIC ACID-NACL 1000-0.7 MG/100ML-% IV SOLN
1000.0000 mg | Freq: Once | INTRAVENOUS | Status: AC
  Administered 2020-06-11: 1000 mg via INTRAVENOUS
  Filled 2020-06-11: qty 100

## 2020-06-11 MED ORDER — LIDOCAINE HCL (PF) 1 % IJ SOLN: 30.0000 mL | INTRAMUSCULAR | Status: DC | PRN

## 2020-06-11 MED ORDER — WITCH HAZEL-GLYCERIN EX PADS: 1.0000 "application " | MEDICATED_PAD | CUTANEOUS | Status: DC | PRN

## 2020-06-11 MED ORDER — BUPIVACAINE HCL (PF) 0.25 % IJ SOLN
INTRAMUSCULAR | Status: DC | PRN
  Administered 2020-06-11: 10 mL via EPIDURAL

## 2020-06-11 MED ORDER — DIPHENHYDRAMINE HCL 25 MG PO CAPS: 25.0000 mg | ORAL_CAPSULE | Freq: Four times a day (QID) | ORAL | Status: DC | PRN

## 2020-06-11 MED ORDER — LACTATED RINGERS IV SOLN: INTRAVENOUS | Status: DC

## 2020-06-11 MED ORDER — OXYTOCIN BOLUS FROM INFUSION
333.0000 mL | Freq: Once | INTRAVENOUS | Status: AC
  Administered 2020-06-11: 333 mL via INTRAVENOUS

## 2020-06-11 MED ORDER — MEASLES, MUMPS & RUBELLA VAC IJ SOLR: 0.5000 mL | Freq: Once | INTRAMUSCULAR | Status: DC

## 2020-06-11 MED ORDER — IBUPROFEN 600 MG PO TABS
600.0000 mg | ORAL_TABLET | Freq: Four times a day (QID) | ORAL | Status: DC
  Administered 2020-06-11 – 2020-06-13 (×7): 600 mg via ORAL
  Filled 2020-06-11 (×7): qty 1

## 2020-06-11 MED ORDER — BENZOCAINE-MENTHOL 20-0.5 % EX AERO
1.0000 "application " | INHALATION_SPRAY | CUTANEOUS | Status: DC | PRN
  Administered 2020-06-12: 1 via TOPICAL
  Filled 2020-06-11: qty 56

## 2020-06-11 MED ORDER — PRENATAL MULTIVITAMIN CH
1.0000 | ORAL_TABLET | Freq: Every day | ORAL | Status: DC
  Administered 2020-06-12: 1 via ORAL
  Filled 2020-06-11: qty 1

## 2020-06-11 MED ORDER — METHYLERGONOVINE MALEATE 0.2 MG/ML IJ SOLN: 0.2000 mg | Freq: Once | INTRAMUSCULAR | Status: AC

## 2020-06-11 MED ORDER — OXYCODONE-ACETAMINOPHEN 5-325 MG PO TABS: 2.0000 | ORAL_TABLET | ORAL | Status: DC | PRN

## 2020-06-11 MED ORDER — PHENYLEPHRINE 40 MCG/ML (10ML) SYRINGE FOR IV PUSH (FOR BLOOD PRESSURE SUPPORT)
80.0000 ug | PREFILLED_SYRINGE | INTRAVENOUS | Status: AC | PRN
  Administered 2020-06-11 (×3): 80 ug via INTRAVENOUS

## 2020-06-11 MED ORDER — METHYLERGONOVINE MALEATE 0.2 MG PO TABS
0.2000 mg | ORAL_TABLET | Freq: Four times a day (QID) | ORAL | Status: AC
  Administered 2020-06-11 – 2020-06-12 (×6): 0.2 mg via ORAL
  Filled 2020-06-11 (×6): qty 1

## 2020-06-11 MED ORDER — ACETAMINOPHEN 325 MG PO TABS: 650.0000 mg | ORAL_TABLET | ORAL | Status: DC | PRN

## 2020-06-11 MED ORDER — ONDANSETRON HCL 4 MG/2ML IJ SOLN
4.0000 mg | Freq: Four times a day (QID) | INTRAMUSCULAR | Status: DC | PRN
  Administered 2020-06-11: 4 mg via INTRAVENOUS
  Filled 2020-06-11: qty 2

## 2020-06-11 MED ORDER — ONDANSETRON HCL 4 MG/2ML IJ SOLN
4.0000 mg | INTRAMUSCULAR | Status: DC | PRN
  Administered 2020-06-11: 4 mg via INTRAVENOUS
  Filled 2020-06-11: qty 2

## 2020-06-11 MED ORDER — EPHEDRINE 5 MG/ML INJ: 10.0000 mg | INTRAVENOUS | Status: DC | PRN

## 2020-06-11 MED ORDER — OXYTOCIN-SODIUM CHLORIDE 30-0.9 UT/500ML-% IV SOLN
1.0000 m[IU]/min | INTRAVENOUS | Status: DC
  Administered 2020-06-11: 2 m[IU]/min via INTRAVENOUS

## 2020-06-11 MED ORDER — LIDOCAINE HCL (PF) 1 % IJ SOLN
INTRAMUSCULAR | Status: DC | PRN
  Administered 2020-06-11: 11 mL via EPIDURAL

## 2020-06-11 MED ORDER — ONDANSETRON HCL 4 MG PO TABS: 4.0000 mg | ORAL_TABLET | ORAL | Status: DC | PRN

## 2020-06-11 MED ORDER — COCONUT OIL OIL: 1.0000 "application " | TOPICAL_OIL | Status: DC | PRN

## 2020-06-11 MED ORDER — SODIUM CHLORIDE 0.9 % IV SOLN
500.0000 mg | Freq: Once | INTRAVENOUS | Status: AC
  Administered 2020-06-11: 500 mg via INTRAVENOUS
  Filled 2020-06-11: qty 25

## 2020-06-11 MED ORDER — FENTANYL-BUPIVACAINE-NACL 0.5-0.125-0.9 MG/250ML-% EP SOLN
12.0000 mL/h | EPIDURAL | Status: DC | PRN
  Administered 2020-06-11: 12 mL/h via EPIDURAL
  Filled 2020-06-11: qty 250

## 2020-06-11 MED ORDER — FENTANYL CITRATE (PF) 100 MCG/2ML IJ SOLN
50.0000 ug | INTRAMUSCULAR | Status: DC | PRN
  Administered 2020-06-11: 100 ug via INTRAVENOUS
  Administered 2020-06-11: 50 ug via INTRAVENOUS
  Administered 2020-06-11: 100 ug via INTRAVENOUS
  Filled 2020-06-11 (×3): qty 2

## 2020-06-11 MED ORDER — SOD CITRATE-CITRIC ACID 500-334 MG/5ML PO SOLN: 30.0000 mL | ORAL | Status: DC | PRN

## 2020-06-11 MED ORDER — LACTATED RINGERS IV SOLN: 500.0000 mL | Freq: Once | INTRAVENOUS | Status: DC

## 2020-06-11 MED ORDER — PHENYLEPHRINE 40 MCG/ML (10ML) SYRINGE FOR IV PUSH (FOR BLOOD PRESSURE SUPPORT)
80.0000 ug | PREFILLED_SYRINGE | INTRAVENOUS | Status: DC | PRN
  Filled 2020-06-11: qty 10

## 2020-06-11 MED ORDER — OXYCODONE-ACETAMINOPHEN 5-325 MG PO TABS: 1.0000 | ORAL_TABLET | ORAL | Status: DC | PRN

## 2020-06-11 MED ORDER — DIPHENHYDRAMINE HCL 50 MG/ML IJ SOLN: 12.5000 mg | INTRAMUSCULAR | Status: DC | PRN

## 2020-06-11 MED ORDER — TERBUTALINE SULFATE 1 MG/ML IJ SOLN: 0.2500 mg | Freq: Once | INTRAMUSCULAR | Status: DC | PRN

## 2020-06-11 MED ORDER — MAGNESIUM HYDROXIDE 400 MG/5ML PO SUSP: 30.0000 mL | ORAL | Status: DC | PRN

## 2020-06-11 MED ORDER — METHYLERGONOVINE MALEATE 0.2 MG/ML IJ SOLN
INTRAMUSCULAR | Status: AC
  Administered 2020-06-11: 0.2 mg via INTRAMUSCULAR
  Filled 2020-06-11: qty 1

## 2020-06-11 MED ORDER — DIBUCAINE (PERIANAL) 1 % EX OINT: 1.0000 "application " | TOPICAL_OINTMENT | CUTANEOUS | Status: DC | PRN

## 2020-06-11 MED ORDER — TETANUS-DIPHTH-ACELL PERTUSSIS 5-2.5-18.5 LF-MCG/0.5 IM SUSY: 0.5000 mL | PREFILLED_SYRINGE | Freq: Once | INTRAMUSCULAR | Status: DC

## 2020-06-11 MED ORDER — OXYTOCIN-SODIUM CHLORIDE 30-0.9 UT/500ML-% IV SOLN
2.5000 [IU]/h | INTRAVENOUS | Status: DC
  Filled 2020-06-11: qty 500

## 2020-06-11 MED ORDER — SODIUM CHLORIDE 0.9% IV SOLUTION: Freq: Once | INTRAVENOUS | Status: DC

## 2020-06-11 MED ORDER — SIMETHICONE 80 MG PO CHEW
80.0000 mg | CHEWABLE_TABLET | ORAL | Status: DC | PRN
Start: 2020-06-11 — End: 2020-06-13

## 2020-06-11 NOTE — Anesthesia Preprocedure Evaluation (Signed)
Anesthesia Evaluation  Patient identified by MRN, date of birth, ID band Patient awake    Reviewed: Allergy & Precautions, NPO status , Patient's Chart, lab work & pertinent test results  Airway Mallampati: II  TM Distance: >3 FB Neck ROM: Full    Dental no notable dental hx.    Pulmonary neg pulmonary ROS, former smoker,    Pulmonary exam normal breath sounds clear to auscultation       Cardiovascular negative cardio ROS Normal cardiovascular exam Rhythm:Regular Rate:Normal     Neuro/Psych negative neurological ROS  negative psych ROS   GI/Hepatic negative GI ROS, Neg liver ROS,   Endo/Other  negative endocrine ROS  Renal/GU negative Renal ROS  negative genitourinary   Musculoskeletal negative musculoskeletal ROS (+)   Abdominal   Peds negative pediatric ROS (+)  Hematology negative hematology ROS (+) anemia ,   Anesthesia Other Findings   Reproductive/Obstetrics (+) Pregnancy                             Anesthesia Physical Anesthesia Plan  ASA: III  Anesthesia Plan: Epidural   Post-op Pain Management:    Induction:   PONV Risk Score and Plan:   Airway Management Planned:   Additional Equipment:   Intra-op Plan:   Post-operative Plan:   Informed Consent:   Plan Discussed with:   Anesthesia Plan Comments:         Anesthesia Quick Evaluation

## 2020-06-11 NOTE — Lactation Note (Signed)
This note was copied from a baby's chart. Lactation Consultation Note  Patient Name: Wanda Anderson [id number] Date: 06/11/2020   Age:30 hours  Attempted to visit with mom but she was getting ready to go to the bathroom. LC to come back later to do initial assessment.   Maternal Data    Feeding    LATCH Score                    Lactation Tools Discussed/Used    Interventions    Discharge    Consult Status      Janice Bodine Venetia Constable 06/11/2020, 8:02 PM

## 2020-06-11 NOTE — Lactation Note (Signed)
This note was copied from a baby's chart. Lactation Consultation Note  Patient Name: Wanda Anderson CZYSA'Y Date: 06/11/2020   Age:30 hours  Attempted to visit with mom but L&D RN Victorino Dike H. reported that baby went straight to the NICU, only a few minutes of STS right after birth before they put baby to the warmer and got admitted to NICU due to respiratory distress.   L&D RN asked LC to F/U with mom once she gets transferred to [facility] specialty care. Lactation will F/U with mom later today for initial assessment.   Maternal Data    Feeding    LATCH Score                    Lactation Tools Discussed/Used    Interventions    Discharge    Consult Status      [name] 06/11/2020, 2:55 PM

## 2020-06-11 NOTE — Progress Notes (Addendum)
Wanda Anderson is a 30 y.o. G4P1021 at [redacted]w[redacted]d.  Subjective: Comfortable w/ epidural. Feeling shaky and like her heart is beating fast. Currently hypotensive 80's/50's post-epidural. Just received Phenylephrine.   Objective: BP (!) 95/57   Pulse 79   Temp 98.2 F (36.8 C) (Oral)   Resp 20   Ht 5\' 5"  (1.651 m)   Wt 69.4 kg   SpO2 99%   BMI 25.46 kg/m    FHT:  FHR: 140 bpm, variability: mod,  accelerations:  15x15,  decelerations:  none UC:   Q 2-8 minutes, moderate Dilation: 7 Effacement (%): 60 Cervical Position: Middle Station: 0 Presentation: Vertex Exam by:: [email or web address], CNM IUPC placed w/out difficulty  Labs: Results for orders placed or performed during the hospital encounter of 06/10/20 (from the past 24 hour(s))  Resp Panel by RT-PCR (Flu A&B, Covid) Nasopharyngeal Swab     Status: None   Collection Time: 06/10/20 11:35 PM   Specimen: Nasopharyngeal Swab; Nasopharyngeal(NP) swabs in vial transport medium  Result Value Ref Range   SARS Coronavirus 2 by RT PCR NEGATIVE NEGATIVE   Influenza A by PCR NEGATIVE NEGATIVE   Influenza B by PCR NEGATIVE NEGATIVE  Type and screen [facility]     Status: None (Preliminary result)   Collection Time: 06/10/20 11:35 PM  Result Value Ref Range   ABO/RH(D) O POS    Antibody Screen NEG    Sample Expiration 06/13/2020,2359    Unit Number 08/13/2020    Blood Component Type RED CELLS,LR    Unit division 00    Status of Unit ALLOCATED    Transfusion Status OK TO TRANSFUSE    Crossmatch Result      Compatible Performed at [facility], [address] [place], [place]    Unit Number [id number]    Blood Component Type RED CELLS,LR    Unit division 00    Status of Unit ALLOCATED    Transfusion Status OK TO TRANSFUSE    Crossmatch Result Compatible   CBC     Status: Abnormal   Collection Time: 06/10/20 11:37 PM  Result Value Ref Range   WBC 16.1 (H) 4.0 - 10.5 K/uL   RBC 3.52 (L) 3.87  - 5.11 MIL/uL   Hemoglobin 7.4 (L) 12.0 - 15.0 g/dL   HCT 08/10/20 (L) 26.3 - 33.5 %   MCV 71.6 (L) 80.0 - 100.0 fL   MCH 21.0 (L) 26.0 - 34.0 pg   MCHC 29.4 (L) 30.0 - 36.0 g/dL   RDW 45.6 (H) 25.6 - 38.9 %   Platelets 283 150 - 400 K/uL   nRBC 0.2 0.0 - 0.2 %  RPR     Status: None   Collection Time: 06/10/20 11:37 PM  Result Value Ref Range   RPR Ser Ql NON REACTIVE NON REACTIVE  HIV Antibody (routine testing w rflx)     Status: None   Collection Time: 06/11/20 12:42 AM  Result Value Ref Range   HIV Screen 4th Generation wRfx Non Reactive Non Reactive  Hepatitis B surface antigen     Status: None   Collection Time: 06/11/20 12:42 AM  Result Value Ref Range   Hepatitis B Surface Ag NON REACTIVE NON REACTIVE  ABO/Rh     Status: None   Collection Time: 06/11/20 12:42 AM  Result Value Ref Range   ABO/RH(D)      O POS Performed at [facility], [address] [place], [place]  Prepare RBC (crossmatch)     Status: None   Collection Time: 06/11/20 12:42 AM  Result Value Ref Range   Order Confirmation      ORDER PROCESSED BY BLOOD BANK Performed at [facility], [address] [place], [place] [address]   Urine rapid drug screen (hosp performed)     Status: None   Collection Time: 06/11/20  1:13 AM  Result Value Ref Range   Opiates NONE DETECTED NONE DETECTED   Cocaine NONE DETECTED NONE DETECTED   Benzodiazepines NONE DETECTED NONE DETECTED   Amphetamines NONE DETECTED NONE DETECTED   Tetrahydrocannabinol NONE DETECTED NONE DETECTED   Barbiturates NONE DETECTED NONE DETECTED    Assessment / Plan: [redacted]w[redacted]d week IUP Labor: Protracted active phase likely due to inadequate contractions. Strat pitocin. Titrate to achieve adequate MVU's Fetal Wellbeing:  Category I Pain Control:  Epidural Anticipated MOD:  SVD Anemia: Plan TXA, Methergine and Pitocin at delivery. 2U PRBC's Typed and crossed. Dr. Alysia Penna aware.    Katrinka Blazing, [place], CNM 06/11/2020 10:14 AM

## 2020-06-11 NOTE — Anesthesia Procedure Notes (Signed)
Epidural Patient location during procedure: OB Start time: 06/11/2020 8:23 AM End time: 06/11/2020 8:46 AM  Staffing Anesthesiologist: Lowella Curb, MD Performed: anesthesiologist   Preanesthetic Checklist Completed: patient identified, IV checked, site marked, risks and benefits discussed, surgical consent, monitors and equipment checked, pre-op evaluation and timeout performed  Epidural Patient position: sitting Prep: ChloraPrep Patient monitoring: heart rate, cardiac monitor, continuous pulse ox and blood pressure Approach: midline Location: L2-L3 Injection technique: LOR saline  Needle:  Needle type: Tuohy  Needle gauge: 17 G Needle length: 9 cm Needle insertion depth: 6 cm Catheter type: closed end flexible Catheter size: 20 Guage Catheter at skin depth: 10 cm Test dose: negative  Assessment Events: blood not aspirated, injection not painful, no injection resistance, no paresthesia and negative IV test  Additional Notes Reason for block:procedure for pain

## 2020-06-11 NOTE — Lactation Note (Signed)
This note was copied from a baby's chart. Lactation Consultation Note  Patient Name: Wanda Anderson [id number]'U Date: 06/11/2020 Reason for consult: Initial assessment;Early term 37-38.6wks;NICU baby Age:30 hours  Visited with mom of 6 hours old ETI NICU female, she's a P2 and BF her first child for a month. Offered assistance with hand expression but mom politely declined, she told LC she'd like to try at another time but that she was familiar with hand expression. She was thankful though for being offered because she had her first baby in [place] and nobody taught her anything about BF.  LC resized mom in flanges # 21, she had already started pumping and getting some colostrum already, praised her for her efforts. Reviewed pumping schedule, benefits of breastmilk for NICU babies and lactogenesis II.  Feeding plan:  1. Encouraged mom to pump every 2-3 hours, ideally 8 pumping sessions/24 hours 2. She needs to be taught hand expression tomorrow to make sure she's doing it right 3. She'll continue labeling her EBM to be taken to the NICU  BF brochure, BF resources and NICU booklet were reviewed. [name] present and supportive, GOB (house supervisor Verdon Cummins) also stopped by to visit mom. Family reported all questions and concerns were answered, they're all aware of LC OP services and will call PRN.  Maternal Data Has patient been taught Hand Expression?: No Does the patient have breastfeeding experience prior to this delivery?: Yes How long did the patient breastfeed?: one month  Feeding Mother's Current Feeding Choice: Breast Milk and Formula  Lactation Tools Discussed/Used Tools: Pump;Flanges Flange Size: 21 Breast pump type: Double-Electric Breast Pump Pump Education: Setup, frequency, and cleaning;Milk Storage Reason for Pumping: NICU infant Pumping frequency: q 3 hours Pumped volume: 2 mL  Interventions Interventions: Breast feeding basics reviewed;DEBP;Education  Discharge Pump:  Personal WIC Program: No  Consult Status Consult Status: Follow-up Date: 06/12/20 Follow-up type: In-patient    Wanda Anderson 06/11/2020, 8:37 PM

## 2020-06-11 NOTE — Discharge Summary (Signed)
Postpartum Discharge Summary    Patient Name: Wanda Anderson DOB: 1990/04/12 MRN: [id number]  Date of admission: 06/10/2020 Delivery date:06/11/2020  Delivering provider: Manya Silvas  Date of discharge: 06/13/2020  Admitting diagnosis: Normal labor [O80, Z37.9] Intrauterine pregnancy: [redacted]w[redacted]d    Secondary diagnosis:  Principal Problem:   Vaginal delivery Active Problems:   Normal labor      Discharge diagnosis: Term Pregnancy Delivered and Anemia                                              Post partum procedures:IV Iron Augmentation: AROM and Pitocin Complications: None  Hospital course: Onset of Labor With Vaginal Delivery      30y.o. yo [id number] admitted in Latent Labor on 06/10/2020. Patient had an uncomplicated labor course as follows:  Membrane Rupture Time/Date: 2:07 AM ,06/11/2020   Delivery Method:Vaginal, Spontaneous  Episiotomy: None  Lacerations:  2nd degree  Patient had an uncomplicated postpartum course.  She is ambulating, tolerating a regular diet, passing flatus, and urinating well. Patient is discharged home in stable condition on 06/13/20.  Newborn Data: Birth date:06/11/2020  Birth time:1:44 PM  Gender:Female  Living status:Living  Apgars:6 ,7  Weight:2920 g   Magnesium Sulfate received: No BMZ received: No Rhophylac:N/A MMR:N/A Transfusion:Yes  Physical exam  Vitals:   06/12/20 1617 06/12/20 1919 06/13/20 0000 06/13/20 0746  BP: 104/60 (!) 85/52 (!) 91/48 (!) 83/45  Pulse: 68 84 71 65  Resp: '16 16 16   ' Temp: 98.1 F (36.7 C) 98.6 F (37 C) 98 F (36.7 C)   TempSrc: Oral Oral Oral   SpO2: 96% 95% 95% 95%  Weight:      Height:       General: alert, cooperative and no distress Lochia: appropriate Uterine Fundus: firm DVT Evaluation: No evidence of DVT seen on physical exam. Labs: Lab Results  Component Value Date   WBC 17.4 (H) 06/12/2020   HGB 7.9 (L) 06/12/2020   HCT 27.1 (L) 06/12/2020   MCV 74.7 (L) 06/12/2020   PLT  272 06/12/2020   No flowsheet data found. Edinburgh Score: Edinburgh Postnatal Depression Scale Screening Tool 06/12/2020  I have been able to laugh and see the funny side of things. 0  I have looked forward with enjoyment to things. 0  I have blamed myself unnecessarily when things went wrong. 2  I have been anxious or worried for no good reason. 0  I have felt scared or panicky for no good reason. 0  Things have been getting on top of me. 0  I have been so unhappy that I have had difficulty sleeping. 1  I have felt sad or miserable. 0  I have been so unhappy that I have been crying. 0  The thought of harming myself has occurred to me. 0  Edinburgh Postnatal Depression Scale Total 3     After visit meds:  Allergies as of 06/13/2020      Reactions   Latex Hives, Itching      Medication List    TAKE these medications   ibuprofen 600 MG tablet Commonly known as: ADVIL Take 1 tablet (600 mg total) by mouth every 6 (six) hours.        Discharge home in stable condition Infant Feeding: Bottle and Breast Infant Disposition:NICU Discharge instruction: per After Visit  Summary and Postpartum booklet. Activity: Advance as tolerated. Pelvic rest for 6 weeks.  Diet: iron rich diet Future Appointments:No future appointments. Follow up Visit:  [place] [facility] [place] [facility] Follow up.   Specialty: Obstetrics and Gynecology Why: An appointment will be scheduled for you to follow up in 2-4 weeks for postpartum evaluation and to schedule sterilization procedure Contact information: [address] [place] [phone]               Please schedule this patient for a In person postpartum visit in 4 weeks with the following provider: Any provider at [facility] as patient plans to transfer her care with [place] Additional Postpartum F/U:None  High risk pregnancy complicated by: severe anemia Delivery  mode:  Vaginal, Spontaneous  Anticipated Birth Control:  Plans Interval BTL   06/13/2020 Mora Bellman, MD

## 2020-06-12 DIAGNOSIS — Z3A37 37 weeks gestation of pregnancy: Secondary | ICD-10-CM | POA: Diagnosis not present

## 2020-06-12 DIAGNOSIS — O9902 Anemia complicating childbirth: Secondary | ICD-10-CM | POA: Diagnosis not present

## 2020-06-12 LAB — CBC
HCT: 27.1 % — ABNORMAL LOW (ref 36.0–46.0)
Hemoglobin: 7.9 g/dL — ABNORMAL LOW (ref 12.0–15.0)
MCH: 21.8 pg — ABNORMAL LOW (ref 26.0–34.0)
MCHC: 29.2 g/dL — ABNORMAL LOW (ref 30.0–36.0)
MCV: 74.7 fL — ABNORMAL LOW (ref 80.0–100.0)
Platelets: 272 10*3/uL (ref 150–400)
RBC: 3.63 MIL/uL — ABNORMAL LOW (ref 3.87–5.11)
RDW: 16.6 % — ABNORMAL HIGH (ref 11.5–15.5)
WBC: 17.4 10*3/uL — ABNORMAL HIGH (ref 4.0–10.5)
nRBC: 0.1 % (ref 0.0–0.2)

## 2020-06-12 NOTE — Plan of Care (Signed)
  Problem: Education: Goal: Knowledge of condition will improve Outcome: Completed/Met   Problem: Activity: Goal: Will verbalize the importance of balancing activity with adequate rest periods Outcome: Completed/Met   Problem: Life Cycle: Goal: Chance of risk for complications during the postpartum period will decrease Outcome: Completed/Met   Problem: Role Relationship: Goal: Ability to demonstrate positive interaction with newborn will improve Outcome: Completed/Met

## 2020-06-12 NOTE — Lactation Note (Signed)
This note was copied from a baby's chart. Lactation Consultation Note Reviewed pumping recommendations and reinforced ed.   Patient Name: Wanda Anderson [id number] Date: 06/12/2020 Reason for consult: NICU baby;Follow-up assessment Age:30 hours  Maternal Data Has patient been taught Hand Expression?: Yes Does the patient have breastfeeding experience prior to this delivery?: Yes How long did the patient breastfeed?: 1 mo  Feeding Mother's Current Feeding Choice: Breast Milk and Formula  Interventions Interventions: Breast feeding basics reviewed;Education;DEBP  Discharge Pump: DEBP WIC Program: No (WIC eligible)  Consult Status Consult Status: Follow-up Follow-up type: In-patient   [name], MA IBCLC 06/12/2020, 10:35 AM

## 2020-06-12 NOTE — Anesthesia Postprocedure Evaluation (Signed)
Anesthesia Post Note  Patient: Wanda Anderson  Procedure(s) Performed: AN AD HOC LABOR EPIDURAL     Patient location during evaluation: Mother Baby Anesthesia Type: Epidural Level of consciousness: awake and alert Pain management: pain level controlled Vital Signs Assessment: post-procedure vital signs reviewed and stable Respiratory status: spontaneous breathing, nonlabored ventilation and respiratory function stable Cardiovascular status: stable Postop Assessment: no headache, no backache, epidural receding, patient able to bend at knees, no apparent nausea or vomiting, able to ambulate and adequate PO intake Anesthetic complications: no   No complications documented.  Last Vitals:  Vitals:   06/12/20 0500 06/12/20 0955  BP: 97/66 (!) 91/51  Pulse: 83 87  Resp: 16 16  Temp: 36.6 C 36.7 C  SpO2: 97% 96%    Last Pain:  Vitals:   06/12/20 0955  TempSrc: Oral  PainSc:    Pain Goal: Patients Stated Pain Goal: 3 (06/12/20 0825)                 Laban Emperor

## 2020-06-12 NOTE — Progress Notes (Signed)
Pt returned from NICU by [facility] c/o numbness, tingling in toes, and tightness in legs.   Pt back to bed 2+ non pitting edema and good pulses in feet.   SCD placed on pt.  Enc to keep legs elevated when sitting in a chair

## 2020-06-12 NOTE — Progress Notes (Signed)
Post Partum Day # 1 SVD Subjective: Pt without complaints this morning. Pain controlled. Tolerating diet. Voiding without problems  Objective: Blood pressure 97/66, pulse 83, temperature 97.9 F (36.6 C), resp. rate 16, height 5\' 5"  (1.651 m), weight 69.4 kg, SpO2 97 %, unknown if currently breastfeeding.  Physical Exam:  General: alert Lochia: appropriate Uterine Fundus: firm Incision: healing well DVT Evaluation: No evidence of DVT seen on physical exam.  Recent Labs    06/10/20 2337 06/12/20 0438  HGB 7.4* 7.9*  HCT 25.2* 27.1*    Assessment/Plan: Stable. S/P Venefor for anemia. Continue with progressive care Infant stable in NICU per mother  LOS: 2 days   08/12/20 06/12/2020, 7:06 AM

## 2020-06-12 NOTE — Clinical Social Work Maternal (Signed)
CLINICAL SOCIAL WORK MATERNAL/CHILD NOTE  Patient Details  Name: Wanda Anderson MRN: [id number] Date of Birth: 04/23/1990  Date:  06/12/2020  Clinical Social Worker Initiating Note:  Wanda Wiebelhaus, LCSW Date/Time: Initiated:  06/12/20/1140     Child's Name:  Wanda Anderson Anderson "Wanda Anderson"   Biological Parents:  Mother,Father (Father: Wanda Anderson)   Need for Interpreter:  None   Reason for Referral:  Parental Support of Premature Babies < 32 weeks/or Critically Ill babies,Behavioral Health Concerns   Address:  [address] [place] [address]    Phone number:  [phone] (home)     Additional phone number:   Household Members/Support Persons (HM/SP):   Household Member/Support Person 1,Household Member/Support Person 2,Household Member/Support Person 3   HM/SP Name Relationship DOB or Age  HM/SP -1   dad    HM/SP -2 Wanda Anderson [name]    HM/SP -3 Wanda Anderson son 12/12/15  HM/SP -4        HM/SP -5        HM/SP -6        HM/SP -7        HM/SP -8          Natural Supports (not living in the home):  Parent,Immediate Family   Professional Supports: None   Employment: Unemployed   Type of Work:     Education:  Other (comment) (GED)   Homebound arranged:    Financial Resources:  Medicaid   Other Resources:  Food Stamps  (Plans to apply for WIC)   Cultural/Religious Considerations Which May Impact Care:    Strengths:  Ability to meet basic needs ,Understanding of illness   Psychotropic Medications:         Pediatrician:       Pediatrician List:   [place]    [place]    [place]    [place]    [place]    [place]      Pediatrician Fax Number:    Risk Factors/Current Problems:  Mental Health Concerns    Cognitive State:  Able to Concentrate ,Alert ,Linear Thinking ,Insightful ,Goal Oriented    Mood/Affect:  Calm ,Interested ,Comfortable ,Relaxed    CSW Assessment: CSW met with [name]  at bedside to discuss infant's NICU admission, behavioral health concerns and limited prenatal care, [name] present. CSW introduced self and explained role. [name] was welcoming, pleasant and remained engaged during assessment. [name] reported that she resides with her dad, [name] and son. [name] reported that she is unemployed and receives food stamps. [name] reported that she plans to apply for WIC. [name] reported that she has a car seat and baby bed for infant. CSW informed [name] about [employer]'s [employer] if any assistance is needed obtaining items for infant. CSW inquired about [name]'s support system aside from [name], [name] reported that her dad, step mom and grandma are also supports.   CSW provided review of Sudden Infant Death Syndrome (SIDS) precautions.    CSW and parents discussed infant's NICU admission. CSW informed parents about the NICU, what to expect and resources/supports available while infant is admitted to the NICU. Parents reported that they feel well informed about infant's care. [name] denied any transportation barriers with visiting infant in the NICU and verbalized plan to stay in infant's room.   CSW asked [name] to leave the room to speak with [name] privately. [name] asked [name] to leave the room, [name] left the room.   CSW inquired about   [name]'s mental health history. [name] reported that she was diagnosed with Bipolar and depression over 10 years ago. [name] described her Bipolar as her mood being up and down. [name] reported that she is not currently taking any medication to treat mental health diagnoses. 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 CSW positively affirmed [name]'s coping skill. [name] endorsed having postpartum depression after her son. [name] described her PPD as being nervous and worried about her parenting skills. [name] reported that it lasted a couple weeks and went away on it's own. CSW inquired about how [name] was feeling emotionally after  giving birth, [name] reported that she was feeling sad because of infant's NICU admission. CSW acknowledged and validated [name]'s feelings. CSW discussed emotions associated with a NICU admission. CSW informed [name] that she may be more susceptible to postpartum depression due to her mental health history, [name] verbalized understanding. [name] presented calm and did not demonstrate any acute mental health signs/symptoms. CSW assessed for safety, [name] denied SI, HI and domestic violence.   CSW provided education regarding the baby blues period vs. perinatal mood disorders, discussed treatment and gave resources for mental health follow up if concerns arise.  CSW recommends self-evaluation during the postpartum time period using the New Mom Checklist from Postpartum Progress and encouraged [name] to contact a medical professional if symptoms are noted at any time.    CSW inquired about [name]'s prenatal care and informed [name] that CSW only seen 2 prenatal visits in prenatal records. [name] reported that she had at least 4 visits. [name] reported that she was traveling with [name] for work. CSW explained the hospital drug screen policy as it relates to limited prenatal care if [name] had less than 3 visits. [name] reported having at least 4 visits and reported that there would be no issues with a drug screen. CSW explained that a drug screen had not been completed and agreed to re-review records. [name] denied any questions or concerns.   CSW reviewed prenatal records again and was able to confirm three visits. Hospital drug screen not applicable as [name] had at least 3 prenatal visits.   CSW will continue to offer resources/supports while infant is admitted to the NICU.   CSW Plan/Description:  Sudden Infant Death Syndrome (SIDS) Education,Perinatal Mood and Anxiety Disorder (PMADs) Programmer, systems and Ongoing Assessment of Needs,Other Patient/Family [place],  LCSW 2020-04-04, 3:59 PM

## 2020-06-13 DIAGNOSIS — Z3A37 37 weeks gestation of pregnancy: Secondary | ICD-10-CM | POA: Diagnosis not present

## 2020-06-13 DIAGNOSIS — O9902 Anemia complicating childbirth: Secondary | ICD-10-CM | POA: Diagnosis not present

## 2020-06-13 MED ORDER — IBUPROFEN 600 MG PO TABS: 600.0000 mg | ORAL_TABLET | Freq: Four times a day (QID) | ORAL | 0 refills | Status: DC

## 2020-06-13 NOTE — Lactation Note (Signed)
This note was copied from a baby's chart. Lactation Consultation Note  Patient Name: Boy Reah Justo [id number] Date: 06/13/2020 Reason for consult: Follow-up assessment;NICU baby;Early term 37-38.6wks Age:30 hours  1015 - I followed up with Ms. Bringle this morning. Upon entry, her support person stated that he needed to go get some air. Ms. Mahaffy presents as being visibly upset, and I apologized for my intrusion. She states that they just received some news regarding their baby and that he's "not okay." She was tearful.  I offered to listen or to provide any support I could. She shared with me her previous history with her 30 year old and that there were no complications. She states that her support person has also had previous children with no complications, and that he was having a difficult time with this unexpected situation.  Ms. Ayars states that she was in disbelief that baby would need to stay here between 2-6 weeks. She appeared to be processing this information.  I briefly discussed her discharge status. She plans to room in with baby (she states that she's not leaving his side) except for some times when she will go and visit her 30 year old. She is a new enrollment with WIC in [facility]. I offered to make sure there was a referral with WIC and to follow up. She thanked me. She has all needed pumping supplies at this time.  Feeding Mother's Current Feeding Choice: Breast Milk and Formula  Lactation Tools Discussed/Used Breast pump type: Double-Electric Breast Pump  Interventions    Discharge Pump: DEBP (refer to [facility]) WIC Program:  (new enrollment)  Consult Status Consult Status: Follow-up Date: 06/14/20 Follow-up type: In-patient    Walker Shadow 06/13/2020, 10:48 AM

## 2020-06-13 NOTE — Discharge Instructions (Signed)

## 2020-06-14 LAB — BPAM RBC
Blood Product Expiration Date: 202206302359
Blood Product Expiration Date: 202206302359
ISSUE DATE / TIME: 202206010709
ISSUE DATE / TIME: 202206010709
Unit Type and Rh: 5100
Unit Type and Rh: 5100

## 2020-06-14 LAB — TYPE AND SCREEN
ABO/RH(D): O POS
Antibody Screen: NEGATIVE
Unit division: 0
Unit division: 0

## 2020-06-14 LAB — RUBELLA SCREEN: Rubella: 4.15 index (ref >0.99)

## 2020-06-15 ENCOUNTER — Ambulatory Visit: Payer: Self-pay

## 2020-06-15 NOTE — Lactation Note (Addendum)
This note was copied from a baby's chart. Lactation Consultation Note  Patient Name: Wanda Anderson Date: 06/15/2020 Reason for consult: Follow-up assessment;Early term 37-38.6wks;NICU baby HIE, post cooling baby Age:30 days  RN requested LC to consult with Mom who is rooming-in with baby.   Baby is currently being gavage fed formula.  Mom states its due to her small supply.  Numerous small bottles of EBM in refrigerator.    Mom concerned about flanges being too short for her nipples.  Mom has long, erect nipples and very minimal breast tissue.  Mom denies any breast changes in early pregnancy.    Mom wanting to try a larger flange size. It appears that 24 mm flanges would be the correct size, but Mom desires a longer tunnel for her nipple.  Mom states her nipples are pulling into flange to end and its painful after pumping is completed.  Elastic band provided (small) and LC created a hand's free pumping bra.  Mom given 27 mm flanges.  Mom to pump on initiation setting as she is expressing 5 ml per pumping.    Encouraged Mom to do breast massage and compression with pumping.  Mom has been hunched over while pumping.  Mom reclined in chair and told NOT to look at bottles during pumping to avoid increased anxiety.  2 bins provided for washing and drying of parts.  Mom had been drying partially disassembled parts on edge of sink.  Mom aware of importance of disassembling all parts before washing, rinsing and air drying in separate bin away from sink.  Encouraged Mom to increase frequency of pumping to 6-8 times per 24 hrs.      Lactation Tools Discussed/Used Tools: Pump;Flanges Flange Size: 27 Breast pump type: Double-Electric Breast Pump Pumping frequency: 2 times 24 hrs Pumped volume: 5 mL  Interventions Interventions: Education;DEBP;Hand express;Breast massage   Consult Status Consult Status: Follow-up Date: 06/16/20 Follow-up type: In-patient    Judee Clara 06/15/2020, 1:30 PM

## 2020-06-16 ENCOUNTER — Ambulatory Visit: Payer: Self-pay

## 2020-06-16 NOTE — Lactation Note (Signed)
This note was copied from a baby's chart. Lactation Consultation Note  Patient Name: Wanda Anderson [id number] Date: 06/16/2020 Reason for consult: Follow-up assessment;NICU baby;Engorgement Age:30 days  Mom hunched over pumping with flanges pressed into breasts.  She is feeling a great deal of pain on the outer quadrants of her breasts.  Mom is engorged.  LC decreased flange to 24 size.  Mom felt that was more comfortable than the 27 she was using.   LC discussed hands on pumping.  Previous LC made a hands free bra but mom was not using it.  While pumping, LC noticed air coming between breast tissue and shell of flange.  Gently tucking the tissue in shell to "fill" the space helped reduce this.  Mom is using maintenance setting and states she has been for the entire time she's been pumping during her stay. Mom was under the impression the pump shuts off at 30 minutes.  Pump education provided for mom and setting explained.   Engorgement prevention education provided.    LC and mom discussed milk supply drying up with first child.  Mom also states she did not have any breast changes during this pregnancy.  Importance of pumping regularly was discussed. LC praised moms pumping efforts today and celebrated with her when she finished pumping and collecting 25 mls.  LC provided two ice packs for mom after pumping.    She was advised to pump for 15-20 minutes using her hands to help remove milk from the breasts.  Goal is to pump 8X in 24 hours, every 2-3 hours taking a stretch of 4 hours to rest at night when needed.      Maternal Data    Feeding Mother's Current Feeding Choice: Breast Milk and Formula  LATCH Score                    Lactation Tools Discussed/Used Tools: Pump Flange Size: 24 Breast pump type: Double-Electric Breast Pump Pump Education: Setup, frequency, and cleaning Reason for Pumping: NICU Pumping frequency: 3 X today Pumped volume: 25  mL  Interventions Interventions: DEBP;Ice;Breast massage  Discharge Discharge Education: Engorgement and breast care Pump: DEBP  Consult Status Consult Status: Follow-up Date: 06/17/20 Follow-up type: In-patient    Maryruth Hancock [facility] 06/16/2020, 2:25 PM

## 2020-06-16 NOTE — Lactation Note (Signed)
This note was copied from a baby's chart. Lactation Consultation Note  Patient Name: Wanda Anderson [id number] Date: 06/16/2020 Reason for consult: Mother's request;Engorgement Age:30 days  Mother requests LC back in room. Mother states she pumped after using ice and collected ~24mL of EBM. Mother reports feeling some relief but breasts quickly filled back up. Encouraged to use ice again 15 minutes on and pump for relief. Mother does not tolerate massaging at this point. LC offered to come back tomorrow for support.  Feeding Mother's Current Feeding Choice: Breast Milk  Lactation Tools Discussed/Used Flange Size: 24 Breast pump type: Double-Electric Breast Pump Reason for Pumping: NICU baby, engorgement Pumping frequency: Q3 Pumped volume: 45 mL (per mother)  Interventions Interventions: Education;Ice;DEBP;Expressed milk;Breast massage  Discharge Discharge Education: Engorgement and breast care Pump: DEBP  Consult Status Consult Status: Follow-up Date: 06/17/20 Follow-up type: In-patient    Wanda Anderson Ancidey 06/16/2020, 10:48 PM

## 2020-06-16 NOTE — Lactation Note (Signed)
This note was copied from a baby's chart. Lactation Consultation Note  Patient Name: Wanda Anderson [id number] Date: 06/16/2020 Reason for consult: Engorgement Age:30 days  LC in to room to follow up on engorgement. Mother states she is pumping but continues to experience discomfort. Discussed using ice to help with swelling. Mother shares she is pumping for 45 minutes and collecting ~71mL of EBM. LC encouraged mother to call when is time to pump to observe session and make recommendations.  Reinforced pumping 8 -12 times in 24h and limit pumping sessions to until collecting ~47mL combined.   Feeding Mother's Current Feeding Choice: Breast Milk and Formula  Interventions Interventions: Education;DEBP;Ice;Expressed milk  Discharge Discharge Education: Engorgement and breast care  Consult Status Consult Status: Follow-up Date: 06/17/20 Follow-up type: In-patient    Lloyde Ludlam A Higuera Ancidey 06/16/2020, 7:37 PM

## 2020-06-17 ENCOUNTER — Ambulatory Visit: Payer: Self-pay

## 2020-06-17 NOTE — Lactation Note (Signed)
This note was copied from a baby's chart. Lactation Consultation Note Maternal engorgement has improved. Pumping frequency and volume are wnl.  Patient Name: Wanda Anderson [name] [id number] Date: 06/17/2020 Reason for consult: Engorgement;Follow-up assessment Age:29 days   Feeding Mother's Current Feeding Choice: Breast Milk   Consult Status Consult Status: Follow-up Follow-up type: In-patient   [name], MA IBCLC 06/17/2020, 12:25 PM

## 2020-06-21 ENCOUNTER — Ambulatory Visit: Payer: Self-pay

## 2020-06-21 NOTE — Lactation Note (Signed)
This note was copied from a baby's chart. Lactation Consultation Note  Patient Name: Boy Katilynn Sinkler [id number] Date: 06/21/2020 Reason for consult: Follow-up assessment;NICU baby;Engorgement Age:30 days  LC in to room for follow up. Mother reports engorgement seems to have improved. Mother states she is collecting ~4-oz per pumping session (~25-minutes). She explains there are some firm areas around breasts. LC encouraged to continue using ice and massaging prior to pumping. Mother is excited to bring baby to breast.   Feeding Mother's Current Feeding Choice: Breast Milk  Lactation Tools Discussed/Used Tools: Pump;Flanges Breast pump type: Double-Electric Breast Pump Reason for Pumping: NICU baby Pumping frequency: ~Q3 Pumped volume: 120 mL  Interventions Interventions: Education;Expressed milk;DEBP;Ice;Breast massage;Breast feeding basics reviewed  Discharge Discharge Education: Engorgement and breast care  Consult Status Consult Status: Follow-up Date: 06/22/20 Follow-up type: In-patient    Yazmina Pareja A Higuera Ancidey 06/21/2020, 9:03 PM

## 2020-07-12 ENCOUNTER — Other Ambulatory Visit: Payer: Self-pay

## 2020-07-12 ENCOUNTER — Ambulatory Visit (INDEPENDENT_AMBULATORY_CARE_PROVIDER_SITE_OTHER): Payer: Medicaid Other | Admitting: Obstetrics and Gynecology

## 2020-07-12 ENCOUNTER — Encounter: Payer: Self-pay | Admitting: Obstetrics and Gynecology

## 2020-07-12 ENCOUNTER — Other Ambulatory Visit (HOSPITAL_COMMUNITY)
Admission: RE | Admit: 2020-07-12 | Discharge: 2020-07-12 | Disposition: A | Discharge status: Home / Self Care | Payer: Medicaid Other | Source: Ambulatory Visit | Attending: Obstetrics and Gynecology | Admitting: Obstetrics and Gynecology

## 2020-07-12 VITALS — BP 113/77 | HR 69 | Wt 136.2 lb

## 2020-07-12 DIAGNOSIS — Z124 Encounter for screening for malignant neoplasm of cervix: Secondary | ICD-10-CM | POA: Insufficient documentation

## 2020-07-12 DIAGNOSIS — I780 Hereditary hemorrhagic telangiectasia: Secondary | ICD-10-CM | POA: Diagnosis not present

## 2020-07-12 DIAGNOSIS — Z3009 Encounter for other general counseling and advice on contraception: Secondary | ICD-10-CM | POA: Diagnosis not present

## 2020-07-12 LAB — CBC
Hematocrit: 41.8 % (ref 34.0–46.6)
Hemoglobin: 12.9 g/dL (ref 11.1–15.9)
MCH: 25 pg — ABNORMAL LOW (ref 26.6–33.0)
MCHC: 30.9 g/dL — ABNORMAL LOW (ref 31.5–35.7)
MCV: 81 fL (ref 79–97)
Platelets: 244 10*3/uL (ref 150–450)
RBC: 5.17 x10E6/uL (ref 3.77–5.28)
WBC: 6.6 10*3/uL (ref 3.4–10.8)

## 2020-07-12 NOTE — Progress Notes (Signed)
Met with mom while her for Postpartum visit.   Mom reports infant was born at 36 weeks 4 days and was in the NICU for 14 days. She reports infant has not latched and she is pumping. Milk came to volume and she has had a good supply. She reports a lot of pain with pumping and today is feeling engorged and not able to pump as much.   Yesterday she was pumping every 2-3 hours and getting 60-80 ml per pumping. She did not pump during the night and is engorged this morning and was only able to pump less than 1 ounce. She is feeling engorged currently, it has been 6 hours since she pumped.  She reports plugged ducts over the last several days and was able to release with warm shower, massage and pumping.   Mom using Lansinoh pump and # 24 flanges. She is keeping the suction low on the pump. She reports a lot of pain with pumping. Reviewed that per nipple size mom should be pumping with about a 19 mm flange, gave mom and # 21 flanges  to try at home with next pumping. Nipples are everted with small diameter, breasts are smaller in size, however makes a good ilk supply. Reviewed supply and demand and encouraged mom to pump about every 3 hours to prevent engorgement and protect milk supply.   Offered mom OP Lactation appt for assistance with latch, she declined at this time. Enc her to call with any questions or concerns as needed.

## 2020-07-12 NOTE — Progress Notes (Signed)
Post Partum Visit Note  WEDNESDAY Wanda Anderson is a 30 y.o. [phone] female who presents for a postpartum visit. She is 4 weeks postpartum following a normal spontaneous vaginal delivery.  I have fully reviewed the prenatal and intrapartum course. The delivery was at 37.4 gestational weeks.  Anesthesia: epidural. Postpartum course has been uncomplicated. Baby is doing well. Baby is feeding by both breast and bottle - Similac Neosure. Bleeding staining only. Bowel function is normal. Bladder function is normal. Patient is not sexually active. Contraception method is none. Postpartum depression screening: negative.   The pregnancy intention screening data noted above was reviewed. Potential methods of contraception were discussed. The patient elected to proceed with Female Sterilization.    Edinburgh Postnatal Depression Scale - 07/12/20 1010       Edinburgh Postnatal Depression Scale:  In the Past 7 Days   I have been able to laugh and see the funny side of things. 0    I have looked forward with enjoyment to things. 0    I have blamed myself unnecessarily when things went wrong. 2    I have been anxious or worried for no good reason. 0    I have felt scared or panicky for no good reason. 0    Things have been getting on top of me. 0    I have been so unhappy that I have had difficulty sleeping. 0    I have felt sad or miserable. 0    I have been so unhappy that I have been crying. 0    The thought of harming myself has occurred to me. 0    Edinburgh Postnatal Depression Scale Total 2             Health Maintenance Due  Topic Date Due   COVID-19 Vaccine (1) Never done   Hepatitis C Screening  Never done   TETANUS/TDAP  Never done   PAP-Cervical Cytology Screening  Never done   PAP SMEAR-Modifier  Never done    The following portions of the patient's history were reviewed and updated as appropriate: allergies, current medications, past family history, past medical history, past  social history, past surgical history, and problem list.  Review of Systems Pertinent items are noted in HPI.  Objective:   Vitals:   07/12/20 1015  BP: 113/77  Pulse: 69     General:  alert, cooperative, and no distress   Breasts:  not indicated  Lungs: clear to auscultation bilaterally  Heart:  regular rate and rhythm  Abdomen: soft, non-tender; bowel sounds normal; no masses,  no organomegaly   Wound N/a  GU exam:   Cervix WNL, vagina WNL, pap taken, second degree tear healing well       Assessment:    Encounter for postpartum  normal postpartum exam.   Plan:   Essential components of care per ACOG recommendations:  1.  Mood and well being: Patient with negative depression screening today. Reviewed local resources for support.  - Patient tobacco use? No.   - hx of drug use? Yes. Discussed support systems and outpatient/inpatient treatment options.    2. Infant care and feeding:  -Patient currently breastmilk feeding? Yes. Reviewed importance of draining breast regularly to support lactation.  -Social determinants of health (SDOH) reviewed in EPIC.The following needs were identified: history of IV drug use, none current  3. Sexuality, contraception and birth spacing - Patient does not want a pregnancy in the next year.  Desired family size  is 3 children.  - Reviewed forms of contraception in tiered fashion. Patient desired bilateral tubal ligation today.   - Discussed birth spacing of 18 months  4. Sleep and fatigue -Encouraged family/partner/community support of 4 hrs of uninterrupted sleep to help with mood and fatigue  5. Physical Recovery  - Discussed patients delivery and complications. She describes her labor as mixed. - Patient had a Vaginal problems after delivery including anemia and prolonged monitoring for bleeding risk . Patient had a 2nd degree laceration. Perineal healing reviewed. Patient expressed understanding - Patient has urinary incontinence?  No. - Patient is safe to resume physical and sexual activity  6.  Health Maintenance - HM due items addressed Yes - Last pap smear No results found for: DIAGPAP Pap smear done at today's visit.  -Breast Cancer screening indicated? No.   7. Chronic Disease/Pregnancy Condition follow up:  HHT   Will send patient to hematology for further evaluation and recommendations before performing laparoscopic salpingectomy.  - PCP follow up  Warden Fillers, MD [facility], [facility]

## 2020-07-12 NOTE — Progress Notes (Signed)
Patient desires permanent sterilization.  Other reversible forms of contraception (over the counter/barrier methods; hormonal contraceptives including pill, patch, ring, Depo-Provera injection, Nexplanon implant; hormonal IUDs Skyla and Mirena; nonhormonal copper IUD Paragard) were discussed with patient; she declined all these modalities. Also discussed the option of vasectomy for her female partner; she also declined this option. She was given the option of laparoscopic bilateral salpingectomy. For the bilateral salpingectomy, she was told that both tubes will be resected via three small incisions; the failure risk of less than 1%.  Any future pregnancies will have to be attempted via IVF or other fertility procedures.  Reiterated permanence and irreversibility of the procedure.  Also emphasized risk of regret which is noted more in patients less than the age of 73.  All questions were answered. She desires laparoscopic bilateral salpingectomy.  Other risks of the procedure were discussed with patient including but not limited to: bleeding, infection, injury to surrounding organs and need for additional procedures.  Also discussed possibility of post-tubal pain syndrome. Patient verbalized understanding of these risks and wants to proceed with this procedure.  She was told that she will be contacted by our surgical scheduler regarding the time and date of her surgery; routine preoperative instructions of having nothing to eat or drink after midnight on the day prior to surgery and also coming to the hospital 1.5 hours prior to her time of surgery were also emphasized.  She was told she may be called for a preoperative appointment about a week prior to surgery and will be given further preoperative instructions at that visit. Printed patient education handouts about the procedure were given to the patient to review at home. Medicaid papers were signed today.   Mariel Aloe, MD, FACOG Obstetrician &  Gynecologist, [facility], [facility]

## 2020-07-13 ENCOUNTER — Telehealth: Payer: Self-pay | Admitting: *Deleted

## 2020-07-13 ENCOUNTER — Telehealth: Payer: Self-pay

## 2020-07-13 ENCOUNTER — Encounter: Payer: Self-pay | Admitting: *Deleted

## 2020-07-13 NOTE — Telephone Encounter (Addendum)
-----   Message from Warden Fillers, MD sent at 07/13/2020  8:04 AM EDT ----- Hemoglobin is normal, no anemia.  Pt will be informed of her status  Called pt; results given.

## 2020-07-13 NOTE — Telephone Encounter (Signed)
Call to patient. Surgery date options discussed pending hematology clearance. Patient also advised will need to sign sterilization consent per insurance guidelines. (Was previously signed at [facility]). Patient is aware we will schedule surgery for approximately two months out to allow hematology clearance.   Encounter closed.

## 2020-07-13 NOTE — Telephone Encounter (Signed)
Call to patient. Left message on voice mail- procedure scheduled for 9-28 at 1pm, arrive 11am. Will receive letter in mail. Call back if questions. No patient identifiers left in message.

## 2020-07-14 ENCOUNTER — Telehealth: Payer: Self-pay | Admitting: *Deleted

## 2020-07-14 NOTE — Telephone Encounter (Signed)
Per referral called and gave upcoming appointment - requested call back - mailed welcome packet with calendar

## 2020-07-19 LAB — CYTOLOGY - PAP
Comment: NEGATIVE
Diagnosis: HIGH — AB
High risk HPV: POSITIVE — AB

## 2020-07-24 ENCOUNTER — Telehealth: Payer: Self-pay

## 2020-07-24 NOTE — Telephone Encounter (Signed)
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

## 2020-07-24 NOTE — Telephone Encounter (Signed)
Call placed to pt. Pt did not answer. Pt advised to return call to office to discuss results and plan of care.   Will try to call pt again.   Judeth Cornfield, RN

## 2020-07-25 NOTE — Telephone Encounter (Addendum)
Patient returned call regarding PAP smear. Also leaves new contact number she may be reached at. Number changed in patient's profile.   Called pt. Results given. Front office notified to schedule patient for colposcopy procedure following appt with hematology on 08/01/20.

## 2020-08-01 ENCOUNTER — Inpatient Hospital Stay (HOSPITAL_BASED_OUTPATIENT_CLINIC_OR_DEPARTMENT_OTHER): Payer: Medicaid Other | Admitting: Hematology & Oncology

## 2020-08-01 ENCOUNTER — Other Ambulatory Visit: Payer: Self-pay

## 2020-08-01 ENCOUNTER — Encounter: Payer: Self-pay | Admitting: Hematology & Oncology

## 2020-08-01 ENCOUNTER — Inpatient Hospital Stay: Payer: Medicaid Other | Attending: Hematology & Oncology

## 2020-08-01 VITALS — BP 102/49 | HR 63 | Temp 98.0°F | Resp 16 | Ht 65.0 in | Wt 141.0 lb

## 2020-08-01 DIAGNOSIS — Z87891 Personal history of nicotine dependence: Secondary | ICD-10-CM | POA: Insufficient documentation

## 2020-08-01 DIAGNOSIS — I780 Hereditary hemorrhagic telangiectasia: Secondary | ICD-10-CM

## 2020-08-01 LAB — CMP (CANCER CENTER ONLY)
ALT: 10 U/L (ref 0–44)
AST: 12 U/L — ABNORMAL LOW (ref 15–41)
Albumin: 4.5 g/dL (ref 3.5–5.0)
Alkaline Phosphatase: 78 U/L (ref 38–126)
Anion gap: 7 (ref 5–15)
BUN: 20 mg/dL (ref 6–20)
CO2: 26 mmol/L (ref 22–32)
Calcium: 9.8 mg/dL (ref 8.9–10.3)
Chloride: 108 mmol/L (ref 98–111)
Creatinine: 0.78 mg/dL (ref 0.44–1.00)
GFR, Estimated: >60 mL/min (ref >60)
Glucose, Bld: 89 mg/dL (ref 70–99)
Potassium: 4.2 mmol/L (ref 3.5–5.1)
Sodium: 141 mmol/L (ref 135–145)
Total Bilirubin: 0.4 mg/dL (ref 0.3–1.2)
Total Protein: 6.9 g/dL (ref 6.5–8.1)

## 2020-08-01 LAB — PROTIME-INR
INR: 1 (ref 0.8–1.2)
Prothrombin Time: 13.1 seconds (ref 11.4–15.2)

## 2020-08-01 LAB — CBC WITH DIFFERENTIAL (CANCER CENTER ONLY)
Abs Immature Granulocytes: 0.01 10*3/uL (ref 0.00–0.07)
Basophils Absolute: 0.1 10*3/uL (ref 0.0–0.1)
Basophils Relative: 1 %
Eosinophils Absolute: 0.1 10*3/uL (ref 0.0–0.5)
Eosinophils Relative: 2 %
HCT: 40.2 % (ref 36.0–46.0)
Hemoglobin: 12.8 g/dL (ref 12.0–15.0)
Immature Granulocytes: 0 %
Lymphocytes Relative: 39 %
Lymphs Abs: 2 10*3/uL (ref 0.7–4.0)
MCH: 26.8 pg (ref 26.0–34.0)
MCHC: 31.8 g/dL (ref 30.0–36.0)
MCV: 84.3 fL (ref 80.0–100.0)
Monocytes Absolute: 0.5 10*3/uL (ref 0.1–1.0)
Monocytes Relative: 10 %
Neutro Abs: 2.4 10*3/uL (ref 1.7–7.7)
Neutrophils Relative %: 48 %
Platelet Count: 260 10*3/uL (ref 150–400)
RBC: 4.77 MIL/uL (ref 3.87–5.11)
WBC Count: 5 10*3/uL (ref 4.0–10.5)
nRBC: 0 % (ref 0.0–0.2)

## 2020-08-01 LAB — APTT: aPTT: 30 seconds (ref 24–36)

## 2020-08-01 LAB — SAVE SMEAR(SSMR), FOR PROVIDER SLIDE REVIEW

## 2020-08-01 NOTE — Progress Notes (Signed)
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  HPI: [name] is incredibly charming 30 year old white female.  She recently had a second child.  It was a baby boy.  He is a month old.  She had no problems with delivery..  She has a diagnosis of HHT.  Apparently, her mother has it a brother has a some cousins have it.  She has nosebleeds.  She sometimes bleeds from the mouth.  She has never had any intervention for this.  She is supposed to have her fallopian tubes removed.  The gynecologist wanted to make sure that everything was good to be okay for her to have surgery.  Of note, she had a incredibly extensive back surgery for scoliosis.  This was back in 2013.  She had no problems with this surgery.  Patient is had her tonsils out.  She had no problems with bleeding..  She does state that her monthly cycles are a little bit heavy.  She has had no weight loss or weight gain.  There is been no cough.  She has had no hemoptysis.  There is no hematuria.  There is no melena or bright red blood per rectum.  She stopped smoking when she gave birth of her second child.  There is no history of hepatitis.  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  Currently, I would say her performance status is ECOG 0.    Past Medical History:  Diagnosis Date   Anemia    Complication of anesthesia    difficulty placing epidural with previous baby   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HCC)    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HCC)   :   Past Surgical History:  Procedure Laterality Date   BACK SURGERY     rods in the back    TONSILLECTOMY    :   Current Outpatient Medications:    ibuprofen (ADVIL) 600 MG tablet, Take 1 tablet (600 mg total) by mouth every 6 (six) hours., Disp: 30 tablet, Rfl: 0:  :   Allergies  Allergen  Reactions   Amoxapine And Related Itching   Aspirin Other (See Comments)   Latex Hives and Itching  :  History reviewed. No pertinent family history.:   Social History   Socioeconomic History   Marital status: Single    Spouse name: Not on file   Number of children: Not on file   Years of education: Not on file   Highest education level: Not on file  Occupational History   Not on file  Tobacco Use   Smoking status: Former    Types: E-cigarettes   Smokeless tobacco: Never   Tobacco comments:    quit once found out pregnant   Vaping Use   Vaping Use: Former  Substance and Sexual Activity   Alcohol use: Not Currently   Drug use: Not Currently    Comment: history of IV drug use over 6 yrs ago   Sexual activity: Yes  Other Topics Concern   Not on file  Social History Narrative   Not on file   Social Determinants of Health   Financial Resource Strain: Not on file  Food Insecurity: No Food Insecurity   Worried About Running Out of Food in the Last Year: Never true   Ran Out of Food in the Last Year:  Never true  Transportation Needs: No Transportation Needs   Lack of Transportation (Medical): No   Lack of Transportation (Non-Medical): No  Physical Activity: Not on file  Stress: Not on file  Social Connections: Not on file  Intimate Partner Violence: Not on file  : Review of Systems  Constitutional: Negative.   HENT:  Positive for nosebleeds.   Eyes: Negative.   Cardiovascular: Negative.   Gastrointestinal: Negative.   Genitourinary: Negative.   Musculoskeletal: Negative.   Skin: Negative.   Neurological: Negative.   Endo/Heme/Allergies:  Bruises/bleeds easily.  Psychiatric/Behavioral: Negative.      Exam:  This is a well-developed and well-nourished white female in no obvious distress.  Vital signs show temperature of 98.  Pulse 63.  Blood pressure 101/49.  Weight is 141 pounds.  Head and neck exam shows no ocular or oral lesions.  She does have some  telangiectasias on her lip and on the tongue.  There is no adenopathy in the neck.  Thyroid is nonpalpable.  Lungs are clear bilaterally.  Cardiac exam regular rate and rhythm with no murmurs, rubs or bruits.  Abdomen is soft.  She has decent bowel sounds.  There is no fluid wave.  There is no palpable liver or spleen tip.  Back exam shows no tenderness over the spine, ribs or hips.  She does have the scoliosis surgical scar from the sacrum up to the shoulder blades.  Extremity shows no clubbing, cyanosis or edema.  She has good range of motion of her joints.  Skin exam shows some occasional telangiectasias.  Neurological exam is nonfocal. @IPVITALS @    Recent Labs    08/01/20 1040  WBC 5.0  HGB 12.8  HCT 40.2  PLT 260    Recent Labs    08/01/20 1040  NA 141  K 4.2  CL 108  CO2 26  GLUCOSE 89  BUN 20  CREATININE 0.78  CALCIUM 9.8    Blood smear review: None  Pathology: None    Assessment and Plan: Wanda Anderson is a very charming 30 year old white female.  She has HHT.  I would think she fits the criteria for HHT.  I am sure that if we put her through genetic studies, we would find the genetic markers for HHT.  Again, I do not see any issue with her having surgery for the fallopian tube removal.  I do not think she is at any high risk for bleeding with respect to surgery.  I am sure this to be done laparoscopically.  I think that her problem is these nosebleeds that she has.  We need to refer her to ENT to see about cauterization.  Her labs do look that bad.  She really is not anemic.  She is not microcytic.  As such, I do not think that bleeding is a problem for her.  As nice as she is, I just do not think we have to get her back to the office unless she is going to have some surgical procedure and needs to be done or she starts to have bleeding from other areas that might suggest a systemic problem with HHT.  26 clearly are quite a few options that we can use to treat her  systemically if necessary.  I think that because she has a local issue, a local therapy, such as cauterization, would be the way to go.  I spent a good 45 minutes with her.  She is incredibly nice.  She has 2 children.  I have to give her a lot of credit for being able to take care of them and for been able to enjoy a healthy life.

## 2020-08-02 ENCOUNTER — Telehealth: Payer: Self-pay

## 2020-08-02 LAB — VON WILLEBRAND PANEL
Coagulation Factor VIII: 107 % (ref 56–140)
Ristocetin Co-factor, Plasma: 83 % (ref 50–200)
Von Willebrand Antigen, Plasma: 83 % (ref 50–200)

## 2020-08-02 LAB — COAG STUDIES INTERP REPORT

## 2020-08-02 NOTE — Telephone Encounter (Signed)
No 07/31/20 LOS noted   Wanda Anderson 

## 2020-09-01 ENCOUNTER — Other Ambulatory Visit (HOSPITAL_COMMUNITY)
Admission: RE | Admit: 2020-09-01 | Discharge: 2020-09-01 | Disposition: A | Discharge status: Home / Self Care | Payer: Medicaid Other | Source: Ambulatory Visit | Attending: Obstetrics and Gynecology | Admitting: Obstetrics and Gynecology

## 2020-09-01 ENCOUNTER — Other Ambulatory Visit: Payer: Self-pay

## 2020-09-01 ENCOUNTER — Encounter: Payer: Self-pay | Admitting: Obstetrics and Gynecology

## 2020-09-01 ENCOUNTER — Ambulatory Visit (INDEPENDENT_AMBULATORY_CARE_PROVIDER_SITE_OTHER): Payer: Medicaid Other | Admitting: Obstetrics and Gynecology

## 2020-09-01 DIAGNOSIS — R87613 High grade squamous intraepithelial lesion on cytologic smear of cervix (HGSIL): Secondary | ICD-10-CM | POA: Insufficient documentation

## 2020-09-01 DIAGNOSIS — N871 Moderate cervical dysplasia: Secondary | ICD-10-CM | POA: Diagnosis not present

## 2020-09-01 LAB — POCT PREGNANCY, URINE: Preg Test, Ur: NEGATIVE

## 2020-09-01 MED ORDER — IBUPROFEN 800 MG PO TABS
800.0000 mg | ORAL_TABLET | Freq: Once | ORAL | Status: AC
  Administered 2020-09-01: 800 mg via ORAL

## 2020-09-01 NOTE — Progress Notes (Signed)
Patient given informed consent, signed copy in the chart, time out was performed.  Chart reviewed HGSIL noted.  UPT negative.  Placed in lithotomy position. Cervix viewed with speculum and colposcope after application of acetic acid.   Colposcopy adequate?  yes Acetowhite lesions?yes, moderate acetowhite from 9-12 oclock and 3-6 o'clock Punctation? no Mosaicism?   no Abnormal vasculature?  yes Biopsies? Yes at 11 and 5 o'clock ECC?no  COMMENTS: Patient was given post procedure instructions.  If pt has LEEP she will need preop ibuprofen and valium or ativan.  Warden Fillers, MD

## 2020-09-01 NOTE — Addendum Note (Signed)
Addended by: Kathee Delton on: 09/01/2020 11:40 AM   Modules accepted: Orders

## 2020-09-04 ENCOUNTER — Encounter: Payer: Self-pay | Admitting: *Deleted

## 2020-09-04 LAB — SURGICAL PATHOLOGY

## 2020-09-06 ENCOUNTER — Telehealth: Payer: Self-pay | Admitting: *Deleted

## 2020-09-06 ENCOUNTER — Encounter: Payer: Self-pay | Admitting: *Deleted

## 2020-09-06 NOTE — Telephone Encounter (Signed)
Call to patient. Discussed option of adding LEEP procedure to currently scheduled BTL on 9-28. Patient desires to proceed.  Advised will need to arrive at 1030 for new 1230 start time.   Encounter closed.

## 2020-09-12 DIAGNOSIS — I780 Hereditary hemorrhagic telangiectasia: Secondary | ICD-10-CM | POA: Diagnosis not present

## 2020-09-12 DIAGNOSIS — R040 Epistaxis: Secondary | ICD-10-CM | POA: Diagnosis not present

## 2020-09-19 ENCOUNTER — Encounter: Payer: Self-pay | Admitting: Family Medicine

## 2020-09-21 ENCOUNTER — Encounter: Payer: Self-pay | Admitting: Obstetrics and Gynecology

## 2020-09-21 ENCOUNTER — Other Ambulatory Visit: Payer: Self-pay

## 2020-09-21 ENCOUNTER — Ambulatory Visit (INDEPENDENT_AMBULATORY_CARE_PROVIDER_SITE_OTHER): Payer: Medicaid Other | Admitting: Obstetrics and Gynecology

## 2020-09-21 VITALS — BP 116/74 | HR 103 | Ht 65.5 in | Wt 143.2 lb

## 2020-09-21 DIAGNOSIS — Z01818 Encounter for other preprocedural examination: Secondary | ICD-10-CM | POA: Diagnosis not present

## 2020-09-21 DIAGNOSIS — N946 Dysmenorrhea, unspecified: Secondary | ICD-10-CM

## 2020-09-21 DIAGNOSIS — R87613 High grade squamous intraepithelial lesion on cytologic smear of cervix (HGSIL): Secondary | ICD-10-CM | POA: Diagnosis not present

## 2020-09-21 MED ORDER — OXYCODONE HCL 5 MG PO TABS
5.0000 mg | ORAL_TABLET | Freq: Four times a day (QID) | ORAL | 0 refills | Status: DC | PRN
Start: 2020-09-21 — End: 2020-10-04

## 2020-09-21 NOTE — H&P (View-Only) (Signed)
OB/GYN Pre-Op History and Physical  Wanda Anderson is a 30 y.o. [id number] presenting for preoperative visit for laparoscopic bilateral tubal ligation by salpingectomy and LEEP procedure.    Patient desires permanent sterilization.  Other reversible forms of contraception (over the counter/barrier methods; hormonal contraceptives including pill, patch, ring, Depo-Provera injection, Nexplanon implant; hormonal IUDs Skyla and Mirena; nonhormonal copper IUD Paragard) were discussed with patient; she declined all these modalities. Also discussed the option of vasectomy for her female partner; she also declined this option. For the bilateral salpingectomy, she was told that both tubes will be resected via three small incisions; the failure risk of less than 1%.  Any future pregnancies will have to be attempted via IVF or other fertility procedures.  Reiterated permanence and irreversibility of both procedures; in the case of Filshie clip application, attempts to reverse tubal sterilization are often not successful.  Also emphasized risk of regret which is noted more in patients less than the age of 83.  All questions were answered. She desires laparoscopic bilateral salpingectomy.  Other risks of the procedure were discussed with patient including but not limited to: bleeding, infection, injury to surrounding organs and need for additional procedures.  Also discussed possibility of post-tubal pain syndrome. Patient verbalized understanding of these risks and wants to proceed with this procedure.  She was told that she will be contacted by our surgical scheduler regarding the time and date of her surgery; routine preoperative instructions of having nothing to eat or drink after midnight on the day prior to surgery and also coming to the hospital 1.5 hours prior to her time of surgery were also emphasized.  She was told she may be called for a preoperative appointment about a week prior to surgery and will be given  further preoperative instructions at that visit. Printed patient education handouts about the procedure were given to the patient to review at home. Medicaid papers were signed previously.  Pt has seen hematologist for HHT and has been cleared for surgery.       Past Medical History:  Diagnosis Date   Anemia    Complication of anesthesia    difficulty placing epidural with previous baby   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HCC)    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HCC)     Past Surgical History:  Procedure Laterality Date   BACK SURGERY     rods in the back    TONSILLECTOMY      OB History  Gravida Para Term Preterm AB Living  4 2 2   2 2   SAB IAB Ectopic Multiple Live Births  2     0 2    # Outcome Date GA Lbr Len/2nd Weight Sex Delivery Anes PTL Lv  4 Term 06/11/20 [redacted]w[redacted]d 11:19 / 00:25 6 lb 7 oz (2.92 kg) M Vag-Spont EPI  LIV  3 Term 12/12/15 [redacted]w[redacted]d  8 lb (3.629 kg) M Vag-Spont None  LIV  2 SAB           1 SAB             Social History   Socioeconomic History   Marital status: Single    Spouse name: Not on file   Number of children: Not on file   Years of education: Not on file   Highest education level: Not on file  Occupational History   Not on file  Tobacco Use   Smoking status: Former    Types: E-cigarettes   Smokeless tobacco: Never  Tobacco comments:    quit once found out pregnant   Vaping Use   Vaping Use: Former  Substance and Sexual Activity   Alcohol use: Not Currently   Drug use: Not Currently    Comment: history of IV drug use over 6 yrs ago   Sexual activity: Yes  Other Topics Concern   Not on file  Social History Narrative   Not on file   Social Determinants of Health   Financial Resource Strain: Not on file  Food Insecurity: No Food Insecurity   Worried About Programme researcher, broadcasting/film/video in the Last Year: Never true   Ran Out of Food in the Last Year: Never true  Transportation Needs: No Transportation Needs   Lack  of Transportation (Medical): No   Lack of Transportation (Non-Medical): No  Physical Activity: Not on file  Stress: Not on file  Social Connections: Not on file    No family history on file.  (Not in a hospital admission)   Allergies  Allergen Reactions   Amoxapine And Related Itching   Aspirin Other (See Comments)   Latex Hives and Itching    Review of Systems: Negative except for what is mentioned in HPI.     Physical Exam: BP 116/74   Pulse (!) 103   Ht 5' 5.5" (1.664 m)   Wt 143 lb 3.2 oz (65 kg)   LMP 09/17/2020   BMI 23.47 kg/m  CONSTITUTIONAL: Well-developed, well-nourished female in no acute distress.  HENT:  Normocephalic, atraumatic, External right and left ear normal. Oropharynx is clear and moist EYES: Conjunctivae and EOM are normal.  NECK: Normal range of motion, supple, no masses SKIN: Skin is warm and dry. No rash noted. Not diaphoretic. No erythema. No pallor. NEUROLGIC: Alert and oriented to person, place, and time. Normal reflexes, muscle tone coordination. No cranial nerve deficit noted. PSYCHIATRIC: Normal mood and affect. Normal behavior. Normal judgment and thought content. CARDIOVASCULAR: Normal heart rate noted, regular rhythm RESPIRATORY: Effort and breath sounds normal, no problems with respiration noted ABDOMEN: Soft, nontender, nondistended, PELVIC: Deferred MUSCULOSKELETAL: Normal range of motion. No edema and no tenderness. 2+ distal pulses.   Pertinent Labs/Studies:   No results found for this or any previous visit (from the past 72 hour(s)).     Assessment and Plan :Wanda Anderson is a 30 y.o. [id number] here for preoperative visit for laparoscopic salpingectomy and LEEP procedure.  Risks and benefits given for each procedure as well as recovery time and follow up.  Pt is experiencing dysmenorrhea today with her first menses after delivery.  She was prescribed 6  5mg  oxycodone for pain control as her home medications have been effective  and she was obviously uncomfortable today.   Plan for laparoscopic salpingectomy and LEEP NPO Admission labs ordered  , M.D. Attending Obstetrician & Gynecologist, [facility]

## 2020-09-21 NOTE — Progress Notes (Signed)
OB/GYN Pre-Op History and Physical  Wanda Anderson is a 30 y.o. [id number] presenting for preoperative visit for laparoscopic bilateral tubal ligation by salpingectomy and LEEP procedure.    Patient desires permanent sterilization.  Other reversible forms of contraception (over the counter/barrier methods; hormonal contraceptives including pill, patch, ring, Depo-Provera injection, Nexplanon implant; hormonal IUDs Skyla and Mirena; nonhormonal copper IUD Paragard) were discussed with patient; she declined all these modalities. Also discussed the option of vasectomy for her female partner; she also declined this option. For the bilateral salpingectomy, she was told that both tubes will be resected via three small incisions; the failure risk of less than 1%.  Any future pregnancies will have to be attempted via IVF or other fertility procedures.  Reiterated permanence and irreversibility of both procedures; in the case of Filshie clip application, attempts to reverse tubal sterilization are often not successful.  Also emphasized risk of regret which is noted more in patients less than the age of 20.  All questions were answered. She desires laparoscopic bilateral salpingectomy.  Other risks of the procedure were discussed with patient including but not limited to: bleeding, infection, injury to surrounding organs and need for additional procedures.  Also discussed possibility of post-tubal pain syndrome. Patient verbalized understanding of these risks and wants to proceed with this procedure.  She was told that she will be contacted by our surgical scheduler regarding the time and date of her surgery; routine preoperative instructions of having nothing to eat or drink after midnight on the day prior to surgery and also coming to the hospital 1.5 hours prior to her time of surgery were also emphasized.  She was told she may be called for a preoperative appointment about a week prior to surgery and will be given  further preoperative instructions at that visit. Printed patient education handouts about the procedure were given to the patient to review at home. Medicaid papers were signed previously.  Pt has seen hematologist for HHT and has been cleared for surgery.       Past Medical History:  Diagnosis Date   Anemia    Complication of anesthesia    difficulty placing epidural with previous baby   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HCC)    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HCC)     Past Surgical History:  Procedure Laterality Date   BACK SURGERY     rods in the back    TONSILLECTOMY      OB History  Gravida Para Term Preterm AB Living  4 2 2   2 2   SAB IAB Ectopic Multiple Live Births  2     0 2    # Outcome Date GA Lbr Len/2nd Weight Sex Delivery Anes PTL Lv  4 Term 06/11/20 [redacted]w[redacted]d 11:19 / 00:25 6 lb 7 oz (2.92 kg) M Vag-Spont EPI  LIV  3 Term 12/12/15 [redacted]w[redacted]d  8 lb (3.629 kg) M Vag-Spont None  LIV  2 SAB           1 SAB             Social History   Socioeconomic History   Marital status: Single    Spouse name: Not on file   Number of children: Not on file   Years of education: Not on file   Highest education level: Not on file  Occupational History   Not on file  Tobacco Use   Smoking status: Former    Types: E-cigarettes   Smokeless tobacco: Never  Tobacco comments:    quit once found out pregnant   Vaping Use   Vaping Use: Former  Substance and Sexual Activity   Alcohol use: Not Currently   Drug use: Not Currently    Comment: history of IV drug use over 6 yrs ago   Sexual activity: Yes  Other Topics Concern   Not on file  Social History Narrative   Not on file   Social Determinants of Health   Financial Resource Strain: Not on file  Food Insecurity: No Food Insecurity   Worried About Programme researcher, broadcasting/film/video in the Last Year: Never true   Ran Out of Food in the Last Year: Never true  Transportation Needs: No Transportation Needs   Lack  of Transportation (Medical): No   Lack of Transportation (Non-Medical): No  Physical Activity: Not on file  Stress: Not on file  Social Connections: Not on file    No family history on file.  (Not in a hospital admission)   Allergies  Allergen Reactions   Amoxapine And Related Itching   Aspirin Other (See Comments)   Latex Hives and Itching    Review of Systems: Negative except for what is mentioned in HPI.     Physical Exam: BP 116/74   Pulse (!) 103   Ht 5' 5.5" (1.664 m)   Wt 143 lb 3.2 oz (65 kg)   LMP 09/17/2020   BMI 23.47 kg/m  CONSTITUTIONAL: Well-developed, well-nourished female in no acute distress.  HENT:  Normocephalic, atraumatic, External right and left ear normal. Oropharynx is clear and moist EYES: Conjunctivae and EOM are normal.  NECK: Normal range of motion, supple, no masses SKIN: Skin is warm and dry. No rash noted. Not diaphoretic. No erythema. No pallor. NEUROLGIC: Alert and oriented to person, place, and time. Normal reflexes, muscle tone coordination. No cranial nerve deficit noted. PSYCHIATRIC: Normal mood and affect. Normal behavior. Normal judgment and thought content. CARDIOVASCULAR: Normal heart rate noted, regular rhythm RESPIRATORY: Effort and breath sounds normal, no problems with respiration noted ABDOMEN: Soft, nontender, nondistended, PELVIC: Deferred MUSCULOSKELETAL: Normal range of motion. No edema and no tenderness. 2+ distal pulses.   Pertinent Labs/Studies:   No results found for this or any previous visit (from the past 72 hour(s)).     Assessment and Plan :Wanda Anderson is a 30 y.o. [id number] here for preoperative visit for laparoscopic salpingectomy and LEEP procedure.  Risks and benefits given for each procedure as well as recovery time and follow up.  Pt is experiencing dysmenorrhea today with her first menses after delivery.  She was prescribed 6  5mg  oxycodone for pain control as her home medications have been effective  and she was obviously uncomfortable today.   Plan for laparoscopic salpingectomy and LEEP NPO Admission labs ordered  , M.D. Attending Obstetrician & Gynecologist, [facility]

## 2020-09-27 ENCOUNTER — Encounter (HOSPITAL_BASED_OUTPATIENT_CLINIC_OR_DEPARTMENT_OTHER): Payer: Self-pay | Admitting: Obstetrics and Gynecology

## 2020-09-27 NOTE — Progress Notes (Signed)
Reviewing pt chart for pre-op interview for surgery on 10-04-2020. Noted in Dr Donavan Foil note pt has hematology clearance for surgery. Not available in epic and scanned in epic.  Sent inbox message to Kennon Rounds Alisa Graff ) , OR scheduler, and requested for clearance to be faxed.

## 2020-10-02 ENCOUNTER — Encounter (HOSPITAL_BASED_OUTPATIENT_CLINIC_OR_DEPARTMENT_OTHER): Payer: Self-pay | Admitting: Obstetrics and Gynecology

## 2020-10-02 ENCOUNTER — Other Ambulatory Visit: Payer: Self-pay

## 2020-10-02 NOTE — Progress Notes (Addendum)
ADDENDUM:  Reviewed pt chart via phone w/ anesthesia, Dr Krista Blue MDA, stated ok to proceed.   Spoke w/ via phone for pre-op interview--- Pt Lab needs dos----   cbc, t&s, urine preg           Lab results------ no COVID test -----patient states asymptomatic no test needed Arrive at ------- 1045 on 10-04-2020 NPO after MN NO Solid Food.  Clear liquids from MN until--- 0945 Med rec completed Medications to take morning of surgery ----- none Diabetic medication ----- n/a Patient instructed no nail polish to be worn day of surgery Patient instructed to bring photo id and insurance card day of surgery Patient aware to have Driver (ride ) / caregiver for 24 hours after surgery --sig other, william call Patient Special Instructions ----- n/a Pre-Op special Istructions ----- pt has hematology medical clearance by dr Myna Hidalgo dated 08-01-2020, in epic/ chart Patient verbalized understanding of instructions that were given at this phone interview. Patient denies shortness of breath, chest pain, fever, cough at this phone interview.

## 2020-10-04 ENCOUNTER — Encounter (HOSPITAL_BASED_OUTPATIENT_CLINIC_OR_DEPARTMENT_OTHER)
Admission: RE | Disposition: A | Discharge status: Home / Self Care | Payer: Self-pay | Source: Home / Self Care | Attending: Obstetrics and Gynecology

## 2020-10-04 ENCOUNTER — Encounter (HOSPITAL_BASED_OUTPATIENT_CLINIC_OR_DEPARTMENT_OTHER): Payer: Self-pay | Admitting: Obstetrics and Gynecology

## 2020-10-04 ENCOUNTER — Ambulatory Visit (HOSPITAL_BASED_OUTPATIENT_CLINIC_OR_DEPARTMENT_OTHER): Payer: Medicaid Other | Admitting: Anesthesiology

## 2020-10-04 ENCOUNTER — Telehealth: Payer: Self-pay | Admitting: Obstetrics & Gynecology

## 2020-10-04 ENCOUNTER — Other Ambulatory Visit: Payer: Self-pay

## 2020-10-04 ENCOUNTER — Ambulatory Visit (HOSPITAL_BASED_OUTPATIENT_CLINIC_OR_DEPARTMENT_OTHER)
Admission: RE | Admit: 2020-10-04 | Discharge: 2020-10-04 | Disposition: A | Discharge status: Home / Self Care | Payer: Medicaid Other | Attending: Obstetrics and Gynecology | Admitting: Obstetrics and Gynecology

## 2020-10-04 DIAGNOSIS — F4310 Post-traumatic stress disorder, unspecified: Secondary | ICD-10-CM | POA: Diagnosis not present

## 2020-10-04 DIAGNOSIS — Z641 Problems related to multiparity: Secondary | ICD-10-CM | POA: Diagnosis not present

## 2020-10-04 DIAGNOSIS — Z87891 Personal history of nicotine dependence: Secondary | ICD-10-CM | POA: Diagnosis not present

## 2020-10-04 DIAGNOSIS — Z886 Allergy status to analgesic agent status: Secondary | ICD-10-CM | POA: Diagnosis not present

## 2020-10-04 DIAGNOSIS — D509 Iron deficiency anemia, unspecified: Secondary | ICD-10-CM | POA: Diagnosis not present

## 2020-10-04 DIAGNOSIS — Z91040 Latex allergy status: Secondary | ICD-10-CM | POA: Insufficient documentation

## 2020-10-04 DIAGNOSIS — N871 Moderate cervical dysplasia: Secondary | ICD-10-CM | POA: Diagnosis not present

## 2020-10-04 DIAGNOSIS — Z302 Encounter for sterilization: Secondary | ICD-10-CM | POA: Diagnosis not present

## 2020-10-04 DIAGNOSIS — Z888 Allergy status to other drugs, medicaments and biological substances status: Secondary | ICD-10-CM | POA: Insufficient documentation

## 2020-10-04 DIAGNOSIS — N946 Dysmenorrhea, unspecified: Secondary | ICD-10-CM | POA: Diagnosis not present

## 2020-10-04 DIAGNOSIS — R87613 High grade squamous intraepithelial lesion on cytologic smear of cervix (HGSIL): Secondary | ICD-10-CM

## 2020-10-04 DIAGNOSIS — Z3009 Encounter for other general counseling and advice on contraception: Secondary | ICD-10-CM

## 2020-10-04 DIAGNOSIS — N879 Dysplasia of cervix uteri, unspecified: Secondary | ICD-10-CM | POA: Diagnosis not present

## 2020-10-04 DIAGNOSIS — D069 Carcinoma in situ of cervix, unspecified: Secondary | ICD-10-CM | POA: Diagnosis not present

## 2020-10-04 HISTORY — DX: High grade squamous intraepithelial lesion on cytologic smear of cervix (HGSIL): R87.613

## 2020-10-04 HISTORY — PX: LEEP: SHX91

## 2020-10-04 HISTORY — DX: Iron deficiency anemia, unspecified: D50.9

## 2020-10-04 HISTORY — DX: Personal history of other mental and behavioral disorders: Z86.59

## 2020-10-04 HISTORY — DX: Personal history of (corrected) congenital malformations of heart and circulatory system: Z87.74

## 2020-10-04 HISTORY — DX: Epistaxis: R04.0

## 2020-10-04 HISTORY — DX: Alcohol abuse, uncomplicated: F10.10

## 2020-10-04 HISTORY — DX: Bipolar disorder, unspecified: F31.9

## 2020-10-04 HISTORY — DX: Other psychoactive substance abuse, in remission: F19.11

## 2020-10-04 HISTORY — DX: Other idiopathic scoliosis, thoracolumbar region: M41.25

## 2020-10-04 HISTORY — DX: Family history of other specified conditions: Z84.89

## 2020-10-04 HISTORY — PX: LAPAROSCOPIC BILATERAL SALPINGECTOMY: SHX5889

## 2020-10-04 HISTORY — DX: Post-traumatic stress disorder, unspecified: F43.10

## 2020-10-04 LAB — CBC
HCT: 46.3 % — ABNORMAL HIGH (ref 36.0–46.0)
Hemoglobin: 15.5 g/dL — ABNORMAL HIGH (ref 12.0–15.0)
MCH: 30.1 pg (ref 26.0–34.0)
MCHC: 33.5 g/dL (ref 30.0–36.0)
MCV: 89.9 fL (ref 80.0–100.0)
Platelets: 265 10*3/uL (ref 150–400)
RBC: 5.15 MIL/uL — ABNORMAL HIGH (ref 3.87–5.11)
RDW: 11.9 % (ref 11.5–15.5)
WBC: 7.6 10*3/uL (ref 4.0–10.5)
nRBC: 0 % (ref 0.0–0.2)

## 2020-10-04 LAB — TYPE AND SCREEN
ABO/RH(D): O POS
Antibody Screen: NEGATIVE

## 2020-10-04 LAB — POCT PREGNANCY, URINE: Preg Test, Ur: NEGATIVE

## 2020-10-04 SURGERY — SALPINGECTOMY, BILATERAL, LAPAROSCOPIC
Anesthesia: General | Site: Vagina

## 2020-10-04 MED ORDER — 0.9 % SODIUM CHLORIDE (POUR BTL) OPTIME
TOPICAL | Status: DC | PRN
  Administered 2020-10-04: 500 mL

## 2020-10-04 MED ORDER — IODINE STRONG (LUGOLS) 5 % PO SOLN
ORAL | Status: DC | PRN
  Administered 2020-10-04: 0.1 mL

## 2020-10-04 MED ORDER — LIDOCAINE HCL (PF) 2 % IJ SOLN
INTRAMUSCULAR | Status: AC
  Filled 2020-10-04: qty 15

## 2020-10-04 MED ORDER — DROPERIDOL 2.5 MG/ML IJ SOLN: 0.6250 mg | Freq: Once | INTRAMUSCULAR | Status: DC | PRN

## 2020-10-04 MED ORDER — OXYCODONE HCL 5 MG/5ML PO SOLN: 5.0000 mg | Freq: Once | ORAL | Status: AC | PRN

## 2020-10-04 MED ORDER — ACETAMINOPHEN 500 MG PO TABS
1000.0000 mg | ORAL_TABLET | Freq: Once | ORAL | Status: AC
  Administered 2020-10-04: 1000 mg via ORAL

## 2020-10-04 MED ORDER — OXYCODONE-ACETAMINOPHEN 5-325 MG PO TABS: 1.0000 | ORAL_TABLET | ORAL | 0 refills | Status: AC | PRN

## 2020-10-04 MED ORDER — FERRIC SUBSULFATE SOLN
Status: DC | PRN
  Administered 2020-10-04 (×2): 1

## 2020-10-04 MED ORDER — CLINDAMYCIN PHOSPHATE 900 MG/50ML IV SOLN
INTRAVENOUS | Status: AC
  Filled 2020-10-04: qty 50

## 2020-10-04 MED ORDER — ROCURONIUM BROMIDE 100 MG/10ML IV SOLN
INTRAVENOUS | Status: DC | PRN
  Administered 2020-10-04: 70 mg via INTRAVENOUS

## 2020-10-04 MED ORDER — DEXMEDETOMIDINE (PRECEDEX) IN NS 20 MCG/5ML (4 MCG/ML) IV SYRINGE
PREFILLED_SYRINGE | INTRAVENOUS | Status: DC | PRN
  Administered 2020-10-04 (×2): 4 ug via INTRAVENOUS
  Administered 2020-10-04: 12 ug via INTRAVENOUS

## 2020-10-04 MED ORDER — LACTATED RINGERS IV SOLN: INTRAVENOUS | Status: DC

## 2020-10-04 MED ORDER — SOD CITRATE-CITRIC ACID 500-334 MG/5ML PO SOLN: 30.0000 mL | ORAL | Status: DC

## 2020-10-04 MED ORDER — LIDOCAINE-EPINEPHRINE 1 %-1:100000 IJ SOLN
INTRAMUSCULAR | Status: DC | PRN
  Administered 2020-10-04: 10 mL

## 2020-10-04 MED ORDER — MIDAZOLAM HCL 2 MG/2ML IJ SOLN
INTRAMUSCULAR | Status: DC | PRN
Start: 2020-10-04 — End: 2020-10-04
  Administered 2020-10-04: 2 mg via INTRAVENOUS

## 2020-10-04 MED ORDER — PROPOFOL 10 MG/ML IV BOLUS
INTRAVENOUS | Status: AC
  Filled 2020-10-04: qty 20

## 2020-10-04 MED ORDER — SCOPOLAMINE 1 MG/3DAYS TD PT72
1.0000 | MEDICATED_PATCH | TRANSDERMAL | Status: DC
  Administered 2020-10-04: 1.5 mg via TRANSDERMAL

## 2020-10-04 MED ORDER — GLYCOPYRROLATE 0.2 MG/ML IJ SOLN
INTRAMUSCULAR | Status: DC | PRN
  Administered 2020-10-04 (×2): .1 mg via INTRAVENOUS

## 2020-10-04 MED ORDER — LIDOCAINE HCL (CARDIAC) PF 100 MG/5ML IV SOSY
PREFILLED_SYRINGE | INTRAVENOUS | Status: DC | PRN
  Administered 2020-10-04: 60 mg via INTRAVENOUS

## 2020-10-04 MED ORDER — MIDAZOLAM HCL 2 MG/2ML IJ SOLN
INTRAMUSCULAR | Status: AC
  Filled 2020-10-04: qty 2

## 2020-10-04 MED ORDER — OXYCODONE-ACETAMINOPHEN 5-325 MG PO TABS: 1.0000 | ORAL_TABLET | ORAL | 0 refills | Status: DC | PRN

## 2020-10-04 MED ORDER — SUGAMMADEX SODIUM 200 MG/2ML IV SOLN
INTRAVENOUS | Status: DC | PRN
  Administered 2020-10-04: 200 mg via INTRAVENOUS

## 2020-10-04 MED ORDER — BUPIVACAINE HCL 0.25 % IJ SOLN
INTRAMUSCULAR | Status: DC | PRN
  Administered 2020-10-04: 17 mL

## 2020-10-04 MED ORDER — SCOPOLAMINE 1 MG/3DAYS TD PT72
MEDICATED_PATCH | TRANSDERMAL | Status: AC
  Filled 2020-10-04: qty 1

## 2020-10-04 MED ORDER — PROMETHAZINE HCL 25 MG/ML IJ SOLN: 6.2500 mg | INTRAMUSCULAR | Status: DC | PRN

## 2020-10-04 MED ORDER — POVIDONE-IODINE 10 % EX SWAB: 2.0000 "application " | Freq: Once | CUTANEOUS | Status: DC

## 2020-10-04 MED ORDER — FENTANYL CITRATE (PF) 100 MCG/2ML IJ SOLN: 25.0000 ug | INTRAMUSCULAR | Status: DC | PRN

## 2020-10-04 MED ORDER — PHENYLEPHRINE 40 MCG/ML (10ML) SYRINGE FOR IV PUSH (FOR BLOOD PRESSURE SUPPORT)
PREFILLED_SYRINGE | INTRAVENOUS | Status: AC
  Filled 2020-10-04: qty 10

## 2020-10-04 MED ORDER — CEFAZOLIN SODIUM-DEXTROSE 2-4 GM/100ML-% IV SOLN: 2.0000 g | INTRAVENOUS | Status: DC

## 2020-10-04 MED ORDER — SUGAMMADEX SODIUM 200 MG/2ML IV SOLN: INTRAVENOUS | Status: DC | PRN

## 2020-10-04 MED ORDER — OXYCODONE HCL 5 MG PO TABS
ORAL_TABLET | ORAL | Status: AC
  Filled 2020-10-04: qty 1

## 2020-10-04 MED ORDER — CLINDAMYCIN PHOSPHATE 900 MG/50ML IV SOLN
900.0000 mg | Freq: Once | INTRAVENOUS | Status: AC
  Administered 2020-10-04: 900 mg via INTRAVENOUS

## 2020-10-04 MED ORDER — FENTANYL CITRATE (PF) 250 MCG/5ML IJ SOLN
INTRAMUSCULAR | Status: AC
  Filled 2020-10-04: qty 5

## 2020-10-04 MED ORDER — OXYCODONE HCL 5 MG PO TABS
5.0000 mg | ORAL_TABLET | Freq: Once | ORAL | Status: AC | PRN
  Administered 2020-10-04: 5 mg via ORAL

## 2020-10-04 MED ORDER — ROCURONIUM BROMIDE 10 MG/ML (PF) SYRINGE
PREFILLED_SYRINGE | INTRAVENOUS | Status: AC
  Filled 2020-10-04: qty 20

## 2020-10-04 MED ORDER — ACETAMINOPHEN 500 MG PO TABS
ORAL_TABLET | ORAL | Status: AC
  Filled 2020-10-04: qty 2

## 2020-10-04 MED ORDER — FENTANYL CITRATE (PF) 100 MCG/2ML IJ SOLN
INTRAMUSCULAR | Status: DC | PRN
  Administered 2020-10-04: 25 ug via INTRAVENOUS
  Administered 2020-10-04 (×3): 50 ug via INTRAVENOUS
  Administered 2020-10-04: 25 ug via INTRAVENOUS

## 2020-10-04 MED ORDER — DEXMEDETOMIDINE (PRECEDEX) IN NS 20 MCG/5ML (4 MCG/ML) IV SYRINGE
PREFILLED_SYRINGE | INTRAVENOUS | Status: AC
  Filled 2020-10-04: qty 5

## 2020-10-04 MED ORDER — PROPOFOL 10 MG/ML IV BOLUS
INTRAVENOUS | Status: DC | PRN
  Administered 2020-10-04: 200 mg via INTRAVENOUS
  Administered 2020-10-04: 40 mg via INTRAVENOUS

## 2020-10-04 MED ORDER — ONDANSETRON HCL 4 MG/2ML IJ SOLN
INTRAMUSCULAR | Status: DC | PRN
  Administered 2020-10-04: 4 mg via INTRAVENOUS

## 2020-10-04 SURGICAL SUPPLY — 48 items
ADH SKN CLS APL DERMABOND .7 (GAUZE/BANDAGES/DRESSINGS) ×2
APL SWBSTK 6 STRL LF DISP (MISCELLANEOUS) ×2
APPLICATOR COTTON TIP 6 STRL (MISCELLANEOUS) IMPLANT
APPLICATOR COTTON TIP 6IN STRL (MISCELLANEOUS) ×3
CATH ROBINSON RED A/P 16FR (CATHETERS) ×3 IMPLANT
DERMABOND ADVANCED (GAUZE/BANDAGES/DRESSINGS) ×1
DERMABOND ADVANCED .7 DNX12 (GAUZE/BANDAGES/DRESSINGS) ×2 IMPLANT
DRSG OPSITE POSTOP 3X4 (GAUZE/BANDAGES/DRESSINGS) ×3 IMPLANT
DURAPREP 26ML APPLICATOR (WOUND CARE) ×3 IMPLANT
ELECT BALL LEEP 5MM RED (ELECTRODE) ×3 IMPLANT
ELECT LOOP LEEP RND 20X12 WHT (CUTTING LOOP) ×3
ELECT LOOP LEEP SQR 10X10 ORG (CUTTING LOOP) ×3
ELECT REM PT RETURN 9FT ADLT (ELECTROSURGICAL) ×3
ELECTRODE LOOP LP RND 20X12WHT (CUTTING LOOP) IMPLANT
ELECTRODE LOOP LP SQR 10X10ORG (CUTTING LOOP) ×2 IMPLANT
ELECTRODE REM PT RTRN 9FT ADLT (ELECTROSURGICAL) ×2 IMPLANT
GAUZE 4X4 16PLY ~~LOC~~+RFID DBL (SPONGE) ×3 IMPLANT
GLOVE SRG 8 PF TXTR STRL LF DI (GLOVE) ×2 IMPLANT
GLOVE SURG ENC MOIS LTX SZ8 (GLOVE) ×3 IMPLANT
GLOVE SURG UNDER POLY LF SZ7 (GLOVE) ×6 IMPLANT
GLOVE SURG UNDER POLY LF SZ8 (GLOVE) ×3
GOWN STRL REUS W/TWL LRG LVL3 (GOWN DISPOSABLE) ×3 IMPLANT
GOWN STRL REUS W/TWL XL LVL3 (GOWN DISPOSABLE) ×3 IMPLANT
KIT TURNOVER CYSTO (KITS) ×3 IMPLANT
LIGASURE VESSEL 5MM BLUNT TIP (ELECTROSURGICAL) ×3 IMPLANT
NS IRRIG 1000ML POUR BTL (IV SOLUTION) ×3 IMPLANT
NS IRRIG 500ML POUR BTL (IV SOLUTION) ×1 IMPLANT
PACK LAPAROSCOPY BASIN (CUSTOM PROCEDURE TRAY) ×3 IMPLANT
PACK TRENDGUARD 450 HYBRID PRO (MISCELLANEOUS) ×2 IMPLANT
PACK VAGINAL WOMENS (CUSTOM PROCEDURE TRAY) ×3 IMPLANT
PAD OB MATERNITY 4.3X12.25 (PERSONAL CARE ITEMS) ×3 IMPLANT
PENCIL BUTTON HOLSTER BLD 10FT (ELECTRODE) ×1 IMPLANT
SCOPETTES 8  STERILE (MISCELLANEOUS) ×6
SCOPETTES 8 STERILE (MISCELLANEOUS) ×4 IMPLANT
SET TUBE SMOKE EVAC HIGH FLOW (TUBING) ×3 IMPLANT
SLEEVE XCEL OPT CAN 5 100 (ENDOMECHANICALS) ×3 IMPLANT
SUT MNCRL AB 4-0 PS2 18 (SUTURE) ×3 IMPLANT
SUT SILK 2 0 SH (SUTURE) ×1 IMPLANT
SUT VICRYL 0 UR6 27IN ABS (SUTURE) ×3 IMPLANT
SYR CONTROL 10ML LL (SYRINGE) ×1 IMPLANT
TOWEL OR 17X26 10 PK STRL BLUE (TOWEL DISPOSABLE) ×6 IMPLANT
TRAY FOLEY W/BAG SLVR 14FR (SET/KITS/TRAYS/PACK) ×3 IMPLANT
TRENDGUARD 450 HYBRID PRO PACK (MISCELLANEOUS) ×3
TROCAR BALLN 12MMX100 BLUNT (TROCAR) ×3 IMPLANT
TROCAR XCEL NON-BLD 11X100MML (ENDOMECHANICALS) ×1 IMPLANT
TROCAR XCEL NON-BLD 5MMX100MML (ENDOMECHANICALS) ×3 IMPLANT
TUBE CONNECTING 12X1/4 (SUCTIONS) ×3 IMPLANT
WARMER LAPAROSCOPE (MISCELLANEOUS) ×3 IMPLANT

## 2020-10-04 NOTE — Discharge Instructions (Signed)

## 2020-10-04 NOTE — Interval H&P Note (Signed)
History and Physical Interval Note:  10/04/2020 12:00 PM  Wanda Anderson  has presented today for surgery, with the diagnosis of Undesired fertility.  The various methods of treatment have been discussed with the patient and family. After consideration of risks, benefits and other options for treatment, the patient has consented to  Procedure(s): LAPAROSCOPIC BILATERAL SALPINGECTOMY (Bilateral) LOOP ELECTROSURGICAL EXCISION PROCEDURE (LEEP) WITH COLPO (N/A) as a surgical intervention.  The patient's history has been reviewed, patient examined, no change in status, stable for surgery.  I have reviewed the patient's chart and labs.  Questions were answered to the patient's satisfaction.     Warden Fillers

## 2020-10-04 NOTE — Transfer of Care (Signed)
Immediate Anesthesia Transfer of Care Note  Patient: Wanda Anderson  Procedure(s) Performed: LAPAROSCOPIC BILATERAL SALPINGECTOMY (Bilateral: Abdomen) LOOP ELECTROSURGICAL EXCISION PROCEDURE (LEEP) WITH COLPO (Vagina )  Patient Location: PACU  Anesthesia Type:General  Level of Consciousness: awake and patient cooperative  Airway & Oxygen Therapy: Patient Spontanous Breathing and Patient connected to nasal cannula oxygen  Post-op Assessment: Report given to RN and Post -op Vital signs reviewed and stable  Post vital signs: Reviewed and stable  Last Vitals:  Vitals Value Taken Time  BP 90/65 10/04/20 1419  Temp    Pulse 92 10/04/20 1426  Resp 36 10/04/20 1426  SpO2 96 % 10/04/20 1426  Vitals shown include unvalidated device data.  Last Pain:  Vitals:   10/04/20 1019  TempSrc: Oral  PainSc: 1       Patients Stated Pain Goal: 5 (10/04/20 1019)  Complications: No notable events documented.

## 2020-10-04 NOTE — Telephone Encounter (Signed)
Patient had called after-hours line- the pharmacy where the initial oxy was sent is unable to fill the prescription.  New order was sent to a different pharmacy instead in [place].

## 2020-10-04 NOTE — Op Note (Signed)
Wanda Anderson Presti PROCEDURE DATE: 10/04/2020   PREOPERATIVE DIAGNOSIS:  Undesired fertility, cervical dysplasia  POSTOPERATIVE DIAGNOSIS:  Undesired fertility, cervical dysplasia  PROCEDURE:  Laparoscopic Bilateral Salpingectomy , LEEP  SURGEON:  Dr. Mariel Aloe  ASSISTANT:  n/a  ANESTHESIA:  General endotracheal  COMPLICATIONS:  None immediate.  ESTIMATED BLOOD LOSS:  100 ml.  FLUIDS: 1200 ml LR.  URINE OUTPUT:  50 ml of clear urine.  INDICATIONS: 30 y.o. [id number] with undesired fertility, desires permanent sterilization. Other reversible forms of contraception were discussed with patient; she declines all other modalities.  Risks of procedure discussed with patient including permanence of method, risk of regret, bleeding, infection, injury to surrounding organs and need for additional procedures including laparotomy.  Failure risk less than 0.5% with increased risk of ectopic gestation if pregnancy occurs was also discussed with patient.  Written informed consent was obtained.    FINDINGS:  Normal uterus, fallopian tubes, and ovaries.  TECHNIQUE:  The patient was taken to the operating room where general anesthesia was obtained without difficulty.  She was then placed in the dorsal lithotomy position and prepared and draped in sterile fashion.  After an adequate timeout was performed, a bivalved speculum was then placed in the patient's vagina, and the anterior lip of cervix grasped with the single-tooth tenaculum.  The Hulka uterine manipulator was then advanced into the uterus and secured.  The speculum was removed from the vagina as was the tenaculum.  Attention was then turned to the patient's abdomen where a 6-mm skin incision was made in the umbilical fold.  The Optiview 5-mm trocar and sleeve were then advanced without difficulty with the laparoscope under direct visualization into the abdomen.  The abdomen was then insufflated with carbon dioxide gas.  Adequate pneumoperitoneum  was obtained.  A survey of the patient's pelvis and abdomen revealed the findings above. Bilateral 5-mm lower quadrant ports were then placed under direct visualization.  The fallopian tubes were transected from the uterine attachments and the underlying mesosalpinx with the LigaSure device allowing for bilateral salpingectomy.  A left 11 mm port replaced the 5 mm port to allow easy removal of the engorged tubes. The fallopian tubes were then removed from the abdomen under direct visualization.  The operative site was surveyed, and it was found to be hemostatic.   No intraoperative injury to other surrounding organs was noted.  The abdomen was desufflated and all instruments were then removed from the patient's abdomen.  All skin incisions were closed with 4-0 Vicryl and Dermabond.  The uterine manipulator was removed from the cervix without complications. At this point the LEEP portion of the case was started.    Pap smear and colposcopy previously reviewed.   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  Cervix visualized. Paracervical block placed with 1% lidocaine with epinephrine. Large size LOOP used to remove 2 separate specimens using blend of cut and cautery on LEEP machine.  Edges/Base cauterized with Ball.  Monsel's solution and pressure used for hemostasis.  The anterior specimen was labeled with a stitch at 12 oclock  while the B specimen had a suture placed at 6 oclock for identification.Patient tolerated procedure well.  The patient will be discharged to home as per PACU criteria.  Routine postoperative instructions given.  She was prescribed Percocet.  She will follow up in the clinic in 1 month for postoperative evaluation.   Mariel Aloe, MD, FACOG Attending Obstetrician & Gynecologist Faculty Practice,  [facility] [place]

## 2020-10-04 NOTE — Anesthesia Procedure Notes (Signed)
Procedure Name: Intubation Date/Time: 10/04/2020 12:21 PM Performed by: Earmon Phoenix, CRNA Pre-anesthesia Checklist: Patient identified, Emergency Drugs available, Suction available, Patient being monitored and Timeout performed Patient Re-evaluated:Patient Re-evaluated prior to induction Oxygen Delivery Method: Circle system utilized Preoxygenation: Pre-oxygenation with 100% oxygen Induction Type: IV induction Ventilation: Mask ventilation without difficulty Laryngoscope Size: 4 Grade View: Grade I Tube size: 7.0 mm Number of attempts: 1 Airway Equipment and Method: Stylet Placement Confirmation: ETT inserted through vocal cords under direct vision, positive ETCO2, CO2 detector and breath sounds checked- equal and bilateral Tube secured with: Tape Dental Injury: Teeth and Oropharynx as per pre-operative assessment

## 2020-10-04 NOTE — Anesthesia Preprocedure Evaluation (Addendum)
Anesthesia Evaluation  Patient identified by MRN, date of birth, ID band Patient awake    Reviewed: Allergy & Precautions, NPO status , Patient's Chart, lab work & pertinent test results  Airway Mallampati: I  TM Distance: >3 FB Neck ROM: Full   Comment: telengiectasias over upper and lower gumline, distal end of tongue that bleed easily. Patient was able to make gumline bleed by rubbing her finger over it.  - surgically absent tonsils. Dental no notable dental hx.    Pulmonary neg pulmonary ROS, former smoker,    Pulmonary exam normal breath sounds clear to auscultation       Cardiovascular Exercise Tolerance: Good negative cardio ROS Normal cardiovascular exam Rhythm:Regular Rate:Normal     Neuro/Psych PSYCHIATRIC DISORDERS Anxiety    GI/Hepatic negative GI ROS, (+)     substance abuse  alcohol use,   Endo/Other  negative endocrine ROS  Renal/GU negative Renal ROS  negative genitourinary   Musculoskeletal Idiopathic scoliosis s/p T5-L4 spinal arthrodesis   Abdominal   Peds negative pediatric ROS (+)  Hematology  (+) Blood dyscrasia, anemia , Hereditary hemorrhagic telangiectasia   Anesthesia Other Findings   Reproductive/Obstetrics negative OB ROS                            Anesthesia Physical Anesthesia Plan  ASA: 2  Anesthesia Plan: General   Post-op Pain Management:    Induction: Intravenous  PONV Risk Score and Plan: 3 and Treatment may vary due to age or medical condition, Scopolamine patch - Pre-op, Ondansetron and Dexamethasone  Airway Management Planned: Oral ETT  Additional Equipment: None  Intra-op Plan:   Post-operative Plan: Extubation in OR  Informed Consent: I have reviewed the patients History and Physical, chart, labs and discussed the procedure including the risks, benefits and alternatives for the proposed anesthesia with the patient or authorized  representative who has indicated his/her understanding and acceptance.     Dental advisory given  Plan Discussed with: Anesthesiologist and CRNA  Anesthesia Plan Comments: (Will plan to DL patient to minimize contact with distal tongue. Also, any bleeding will make video laryngoscopy more challenging due to obstruction of camera view. Will have it available as a backup. Tanna Furry, MD  )       Anesthesia Quick Evaluation

## 2020-10-05 ENCOUNTER — Encounter (HOSPITAL_BASED_OUTPATIENT_CLINIC_OR_DEPARTMENT_OTHER): Payer: Self-pay | Admitting: Obstetrics and Gynecology

## 2020-10-05 NOTE — Anesthesia Postprocedure Evaluation (Signed)
Anesthesia Post Note  Patient: Wanda Anderson  Procedure(s) Performed: LAPAROSCOPIC BILATERAL SALPINGECTOMY (Bilateral: Abdomen) LOOP ELECTROSURGICAL EXCISION PROCEDURE (LEEP) WITH COLPO (Vagina )     Patient location during evaluation: PACU Anesthesia Type: General Level of consciousness: awake Pain management: pain level controlled Vital Signs Assessment: post-procedure vital signs reviewed and stable Respiratory status: spontaneous breathing and respiratory function stable Cardiovascular status: stable Postop Assessment: no apparent nausea or vomiting Anesthetic complications: no   No notable events documented.  Last Vitals:  Vitals:   10/04/20 1500 10/04/20 1545  BP: 96/69 102/75  Pulse: 69 77  Resp: 12 16  Temp:  36.6 C  SpO2: 98% 99%    Last Pain:  Vitals:   10/04/20 1545  TempSrc:   PainSc: 6                  Candra R Nixxon Faria

## 2020-10-06 LAB — SURGICAL PATHOLOGY

## 2020-10-07 ENCOUNTER — Encounter: Payer: Self-pay | Admitting: Radiology

## 2020-10-18 ENCOUNTER — Ambulatory Visit (INDEPENDENT_AMBULATORY_CARE_PROVIDER_SITE_OTHER): Payer: Medicaid Other | Admitting: Clinical

## 2020-10-18 ENCOUNTER — Ambulatory Visit: Payer: Medicaid Other | Admitting: Obstetrics and Gynecology

## 2020-10-18 ENCOUNTER — Encounter: Payer: Self-pay | Admitting: Obstetrics and Gynecology

## 2020-10-18 ENCOUNTER — Other Ambulatory Visit: Payer: Self-pay

## 2020-10-18 VITALS — BP 109/77 | HR 66 | Wt 144.6 lb

## 2020-10-18 DIAGNOSIS — Z8659 Personal history of other mental and behavioral disorders: Secondary | ICD-10-CM | POA: Diagnosis not present

## 2020-10-18 DIAGNOSIS — Z9851 Tubal ligation status: Secondary | ICD-10-CM

## 2020-10-18 DIAGNOSIS — F3162 Bipolar disorder, current episode mixed, moderate: Secondary | ICD-10-CM | POA: Diagnosis not present

## 2020-10-18 DIAGNOSIS — N946 Dysmenorrhea, unspecified: Secondary | ICD-10-CM | POA: Diagnosis not present

## 2020-10-18 DIAGNOSIS — F418 Other specified anxiety disorders: Secondary | ICD-10-CM | POA: Insufficient documentation

## 2020-10-18 DIAGNOSIS — F4310 Post-traumatic stress disorder, unspecified: Secondary | ICD-10-CM

## 2020-10-18 DIAGNOSIS — Z9079 Acquired absence of other genital organ(s): Secondary | ICD-10-CM | POA: Diagnosis not present

## 2020-10-18 DIAGNOSIS — R87613 High grade squamous intraepithelial lesion on cytologic smear of cervix (HGSIL): Secondary | ICD-10-CM

## 2020-10-18 MED ORDER — DOCUSATE SODIUM 100 MG PO CAPS: 100.0000 mg | ORAL_CAPSULE | Freq: Two times a day (BID) | ORAL | 2 refills | Status: AC | PRN

## 2020-10-18 MED ORDER — SERTRALINE HCL 50 MG PO TABS: 50.0000 mg | ORAL_TABLET | Freq: Every day | ORAL | 5 refills | Status: DC

## 2020-10-18 MED ORDER — IBUPROFEN 800 MG PO TABS: 800.0000 mg | ORAL_TABLET | Freq: Three times a day (TID) | ORAL | 3 refills | Status: AC | PRN

## 2020-10-18 NOTE — BH Specialist Note (Signed)
Integrated Behavioral Health via Telemedicine Visit  10/18/2020 ARRYANNA HOLQUIN [id number]  Number of Integrated Behavioral Health visits: 2 Session Start time: 9:48  Session End time: 10:34 Total time:  67  Referring Provider: Mariel Aloe, MD Patient/Family location: [facility] Provider location: [facility]  All persons participating in visit: Patient Dhanvi Boesen and [facility] Roniel Halloran   Types of Service: Individual psychotherapy and Video visit  I connected with [name] and/or Doy Mince Janvrin's  children  via  Telephone or Video Enabled Telemedicine Application  (Video is Caregility application) and verified that I am speaking with the correct person using two identifiers. Discussed confidentiality: Yes   I discussed the limitations of telemedicine and the availability of in person appointments.  Discussed there is a possibility of technology failure and discussed alternative modes of communication if that failure occurs.  I discussed that engaging in this telemedicine visit, they consent to the provision of behavioral healthcare and the services will be billed under their insurance.  Patient and/or legal guardian expressed understanding and consented to Telemedicine visit: Yes   Presenting Concerns: Patient and/or family reports the following symptoms/concerns: Waking up with whole body aching/tense after nightmares and sweating/chills at night and  low appetite; pt feels "less void"(depression) since starting Zoloft (taking at half dose for one week). Pt requests Suboxone to cope with "jonesing", with goal to continue abstaining from substances. Pt's children are well-bonded to her and each other, happy and healthy. Pt also concerned about "ends of stitches sticking out"/not healing as she expected.  Duration of problem: Ongoing; Severity of problem:  moderately severe  Patient and/or Family's Strengths/Protective  Factors: Concrete supports in place (healthy food, safe environments, etc.), Sense of purpose, and Physical Health (exercise, healthy diet, medication compliance, etc.)  Goals Addressed: Patient will:  Reduce symptoms of: anxiety, depression, insomnia, mood instability, and stress   Increase knowledge and/or ability of: coping skills   Demonstrate ability to: Increase motivation to adhere to plan of care  Progress towards Goals: Ongoing  Interventions: Interventions utilized:  Mindfulness or Relaxation Training, Medication Monitoring, and Psychoeducation and/or Health Education Standardized Assessments completed: Not Needed  Patient and/or Family Response: Pt agrees to continue treatment plan  Assessment: Patient currently experiencing Bipolar affective disorder, PTSD (both previously diagnosed)  Patient may benefit from continued psychoeducation and brief therapeutic interventions regarding coping with symptoms of anxiety, depression, mood instability, insomnia, life stress. .  Plan: Follow up with behavioral health clinician on : Two weeks Behavioral recommendations:  -Continue taking Zoloft as prescribed -CALM relaxation breathing exercise twice daily (morning; at bedtime) -Scheduler will call to schedule earlier appointment to check stitches, and another appointment to consult with medical provider about starting Suboxone  Referral(s): Integrated [employer] (In Clinic)  I discussed the assessment and treatment plan with the patient and/or parent/guardian. They were provided an opportunity to ask questions and all were answered. They agreed with the plan and demonstrated an understanding of the instructions.   They were advised to call back or seek an in-person evaluation if the symptoms worsen or if the condition fails to improve as anticipated.  Valetta Close Drema Eddington, LCSW

## 2020-10-18 NOTE — BH Specialist Note (Signed)
Integrated Behavioral Health Initial In-Person Visit  MRN: [id number] Name: Wanda Anderson  Number of Integrated Behavioral Health Clinician visits:: 1/6 Session Start time: 11:42 Session End time: 12:00 Total time:  18  minutes  Types of Service: Individual psychotherapy  Interpretor:No. Interpretor Name and Language: n/a   Warm Hand Off Completed.        Subjective: Wanda Anderson is a 30 y.o. female accompanied by  n/a Patient was referred by Nettie Elm, MD for positive depression screen. Patient reports the following symptoms/concerns: Anxious/nervous, increased with grandma in surgery this morning; previously diagnosed with Bipolar 1 disorder, PTSD and ADHD; pt requests medication to manage depression and anxiety; prefers no referral for psychiatry or ongoing therapy due to negative experience in the past.  Duration of problem: Ongoing; Severity of problem:  moderately severe  Objective: Mood: Anxious and Depressed and Affect: Tearful Risk of harm to self or others: No plan to harm self or others  Life Context: Family and Social: Pt has two children (4yo; 51mo boys) School/Work: - Self-Care: - Life Changes: Childbirth 4 months ago  Patient and/or Family's Strengths/Protective Factors: Social connections and Sense of purpose  Goals Addressed: Patient will: Reduce symptoms of: anxiety, depression, insomnia, mood instability, and stress Increase knowledge and/or ability of: stress reduction  Demonstrate ability to: Increase motivation to adhere to plan of care  Progress towards Goals: Ongoing  Interventions: Interventions utilized: Medication Monitoring, Link to [employer], and Supportive Reflection  Standardized Assessments completed: GAD-7 and PHQ 9 (Scores recorded do not accurately reflect current symptoms)  Patient and/or Family Response: Pt agrees with treatment plan  Patient Centered Plan: Patient is on the following Treatment Plan(s):   Integrated BH  Assessment: Patient currently experiencing Bipolar 1 disorder, PTSD, ADHD (all as previously diagnosed).   Patient may benefit from psychoeducation and brief therapeutic interventions regarding coping with symptoms of depression, anxiety, mood instability, insomnia .  Plan: Follow up with behavioral health clinician on : One week; Call Quinesha Selinger at [phone], as needed. [name] will call in 2 days to follow up on medication side effects.  Behavioral recommendations:  -Begin taking Zoloft as prescribed, starting tonight Referral(s): Integrated [employer] [facility] (In Clinic)  Valetta Close [place], [place]  Depression screen [facility] 2/9 10/18/2020 07/12/2020  Decreased Interest 0 0  Down, Depressed, Hopeless 0 0  PHQ - 2 Score 0 0  Altered sleeping 0 0  Tired, decreased energy 0 0  Change in appetite 0 0  Feeling bad or failure about yourself  0 0  Trouble concentrating 0 0  Moving slowly or fidgety/restless 0 0  Suicidal thoughts 0 0  PHQ-9 Score 0 0   GAD 7 : Generalized Anxiety Score 10/18/2020 07/12/2020  Nervous, Anxious, on Edge 0 (No Data)  Control/stop worrying 0 (No Data)  Worry too much - different things 0 (No Data)  Trouble relaxing 0 0  Restless 0 0  Easily annoyed or irritable 0 1  Afraid - awful might happen 0 0  Total GAD 7 Score 0 -

## 2020-10-18 NOTE — Progress Notes (Signed)
Ms Wanda Anderson presents as work in for heavy vaginal bleeding and pelvic pain. Pt reports Sx started 3 days ago. Not as bad today. H/O lap bilateral salpingectomy and LEEP on 10/04/20. Pathology reviewed with pt.  LMP 09/17/20 Some problems with constipation since surgery. She is also very anxious. H/O depression and SSRI use in the past. Denies any Homo/Suicidal ideations. Food insecurity as well.  PE AF VSS Chaperone present during exam  Lungs clear Heart RRR Abd soft + BS, incisions well healed, no S/Sx of infection GU Nl EGBUS, menses noted, LEEP site appears well healed, limited by presence of menses, uterus small, slightly tender, no adnexal masses  A/P Dysmenorrhea        S/P LEEP and bilateral salpingectomy         Depression/anxiety        Food insecurity  Pt reassured. Suspect this is pt's first cycle since surgery. Motrin as need for discomfort. Will refer to [facility]. Start Zoloft. Colace for constipation and food bank. F/U with Dr. Donavan Foil in 2-3 weeks

## 2020-10-18 NOTE — Patient Instructions (Signed)
[facility]  [facility] [address] [place], [place] [address] [phone] (main office) [phone] (Egan Sahlin's office)   

## 2020-10-18 NOTE — Patient Instructions (Signed)
[facility] [place] [facility] [address] [place], [place] [address] [phone] (main office) [phone] (Chidi Shirer's office)   

## 2020-10-19 ENCOUNTER — Telehealth: Payer: Self-pay | Admitting: Clinical

## 2020-10-19 NOTE — Telephone Encounter (Signed)
Pt took Zoloft as prescribed, noticed feeling sleepy, nausea, itchiness, diarrhea and "feel like screaming"; wants to continue to take to give it a chance to work; prefers to take 1/2 pill/day until upcoming visit on 10/23/20, to more gradually adjust to medication; pt agrees for [facility] to inform medical provider she plans to take lower dosage than prescribed, starting 10/20/20.

## 2020-10-23 ENCOUNTER — Ambulatory Visit: Payer: Medicaid Other | Admitting: Obstetrics and Gynecology

## 2020-10-23 ENCOUNTER — Ambulatory Visit (INDEPENDENT_AMBULATORY_CARE_PROVIDER_SITE_OTHER): Payer: Medicaid Other | Admitting: Clinical

## 2020-10-23 DIAGNOSIS — F3162 Bipolar disorder, current episode mixed, moderate: Secondary | ICD-10-CM

## 2020-10-23 DIAGNOSIS — F4310 Post-traumatic stress disorder, unspecified: Secondary | ICD-10-CM

## 2020-10-23 NOTE — BH Specialist Note (Deleted)
Integrated Behavioral Health via Telemedicine Visit  10/23/2020 Wanda Anderson [id number]  Number of Integrated Behavioral Health visits: *** Session Start time: 10:15***  Session End time: 10:45*** Total time: {IBH Total JSEG:31517616}  Referring Provider: *** Patient/Family location: Home*** [facility] Provider location: [facility]  All persons participating in visit: Patient *** and [facility] Wanda Anderson ***  Types of Service: {CHL AMB TYPE OF SERVICE:[phone]}  I connected with Wanda Anderson and/or Wanda Anderson's {family members:20773} via  Telephone or Engineer, civil (consulting)  (Video is Caregility application) and verified that I am speaking with the correct person using two identifiers. Discussed confidentiality: {YES/NO:21197}  I discussed the limitations of telemedicine and the availability of in person appointments.  Discussed there is a possibility of technology failure and discussed alternative modes of communication if that failure occurs.  I discussed that engaging in this telemedicine visit, they consent to the provision of behavioral healthcare and the services will be billed under their insurance.  Patient and/or legal guardian expressed understanding and consented to Telemedicine visit: {YES/NO:21197}  Presenting Concerns: Patient and/or family reports the following symptoms/concerns: *** Duration of problem: ***; Severity of problem: {Mild/Moderate/Severe:20260}  Patient and/or Family's Strengths/Protective Factors: {CHL AMB BH PROTECTIVE [phone]}  Goals Addressed: Patient will:  Reduce symptoms of: {IBH Symptoms:21014056}   Increase knowledge and/or ability of: {IBH Patient Tools:21014057}   Demonstrate ability to: {IBH Goals:21014053}  Progress towards Goals: {CHL AMB BH PROGRESS TOWARDS GOALS:[phone]}  Interventions: Interventions utilized:  {IBH  Interventions:21014054} Standardized Assessments completed: {IBH Screening Tools:21014051}  Patient and/or Family Response: ***  Assessment: Patient currently experiencing ***.   Patient may benefit from ***.  Plan: Follow up with behavioral health clinician on : *** Behavioral recommendations: *** Referral(s): {IBH Referrals:21014055}  I discussed the assessment and treatment plan with the patient and/or parent/guardian. They were provided an opportunity to ask questions and all were answered. They agreed with the plan and demonstrated an understanding of the instructions.   They were advised to call back or seek an in-person evaluation if the symptoms worsen or if the condition fails to improve as anticipated.  Valetta Close Kortlyn Koltz, LCSW

## 2020-10-26 ENCOUNTER — Encounter: Payer: Self-pay | Admitting: *Deleted

## 2020-11-03 ENCOUNTER — Encounter: Payer: Self-pay | Admitting: Obstetrics and Gynecology

## 2020-11-03 ENCOUNTER — Other Ambulatory Visit: Payer: Self-pay

## 2020-11-03 ENCOUNTER — Ambulatory Visit (INDEPENDENT_AMBULATORY_CARE_PROVIDER_SITE_OTHER): Payer: Medicaid Other | Admitting: Obstetrics and Gynecology

## 2020-11-03 ENCOUNTER — Ambulatory Visit: Payer: Medicaid Other | Admitting: Obstetrics and Gynecology

## 2020-11-03 VITALS — BP 118/80 | HR 80 | Wt 141.4 lb

## 2020-11-03 DIAGNOSIS — Z9079 Acquired absence of other genital organ(s): Secondary | ICD-10-CM

## 2020-11-03 DIAGNOSIS — Z4889 Encounter for other specified surgical aftercare: Secondary | ICD-10-CM

## 2020-11-03 NOTE — Progress Notes (Signed)
    Subjective:    Wanda Anderson is a 30 y.o. female who presents to the clinic status post LEEP and bilateral salpingectomy on 10/04/20. The patient is not having any pain.  Eating a regular diet without difficulty. Bowel movements are  normal now, pt did have constipation . No other significant postoperative concerns.  The following portions of the patient's history were reviewed and updated as appropriate: allergies, current medications, past family history, past medical history, past social history, past surgical history, and problem list..  Last pap smear was abnormal on 07/12/20.  Review of Systems Pertinent items are noted in HPI.   Objective:   BP 118/80   Pulse 80   Wt 141 lb 6.4 oz (64.1 kg)   Breastfeeding No   BMI 23.17 kg/m  Constitutional:  Well-developed, well-nourished female in no acute distress.   Skin: Skin is warm and dry, no rash noted, not diaphoretic,no erythema, no pallor.  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  Operative findings again reviewed. Pathology report discussed.   Plan:   1. Continue any current medications. 2. Wound care discussed. 3. Activity restrictions: none 4. Anticipated return to work: now. 5. Follow up in 1 year for annual exam and pap 6.  Routine preventative health maintenance measures emphasized. Please refer to After Visit Summary for other counseling recommendations.    Mariel Aloe, MD, FACOG Attending Obstetrician & Gynecologist Center for [facility], [facility]

## 2020-11-10 ENCOUNTER — Telehealth (INDEPENDENT_AMBULATORY_CARE_PROVIDER_SITE_OTHER): Payer: Medicaid Other | Admitting: Family Medicine

## 2020-11-10 ENCOUNTER — Encounter: Payer: Self-pay | Admitting: Family Medicine

## 2020-11-10 DIAGNOSIS — F1121 Opioid dependence, in remission: Secondary | ICD-10-CM | POA: Diagnosis not present

## 2020-11-10 DIAGNOSIS — Z87898 Personal history of other specified conditions: Secondary | ICD-10-CM | POA: Insufficient documentation

## 2020-11-10 MED ORDER — SUBOXONE 8-2 MG SL FILM: 1.0000 | ORAL_FILM | Freq: Every day | SUBLINGUAL | 0 refills | Status: DC

## 2020-11-10 MED ORDER — NALOXONE HCL 4 MG/0.1ML NA LIQD: NASAL | 1 refills | Status: AC

## 2020-11-10 NOTE — Progress Notes (Signed)
TELEHEALTH GYNECOLOGY VISIT ENCOUNTER NOTE  Provider location: Center for [employer] [facility] [employer] [facility]   Patient location: Home  I connected with Wanda Anderson on 11/10/20 at 10:15 AM EDT by telephone and verified that I am speaking with the correct person using two identifiers. Patient was unable to do MyChart audiovisual encounter due to technical difficulties, she tried several times.    I discussed the limitations, risks, security and privacy concerns of performing an evaluation and management service by telephone and the availability of in person appointments. I also discussed with the patient that there may be a patient responsible charge related to this service. The patient expressed understanding and agreed to proceed.   History:  Wanda Anderson is a 30 y.o. [phone] being seen for suboxone initiation.  Patient delivered with [facility] five months prior Had laparoscopic bilateral salpingectomy w Dr. Donavan Foil last month Also has been seeing [name] and reporting significant cravings for opioids Then two days prior patient's partner died suddenly  Today reports she is struggling In the past difficult times in her life have been a trigger to use opioids and she is afraid she will do so again Reports no substance use for 6 years, in the past used IV heroin, cocaine, and methamphetamines Was taking oxycodone post op from her tubal but has not had any for several weeks Has used suboxone in the past       Past Medical History:  Diagnosis Date   Alcohol abuse    10-02-2020 per pt can drink one bottle of wine daily  but mostly 2 bottles wine per week   Bipolar 1 disorder (HCC)    Family history of adverse reaction to anesthesia    per pt her father has anesthesia awareness, has to be given very high doses   HGSIL on Pap smear of cervix    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HCC)    followed by dr Myna Hidalgo (hematology)   History of depression    History of drug abuse  in remission [facility])    per pt in remission since 12/ 2016   Hx of arteriovenous malformation (AVM)    notated on Dr Noel Gerold H&P for pt's spinal fusion , on prior to 2005, pt [facility], 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, s/p [facility] (finding on 2v rib/ cxr in care everywhere 09-01-2018 )   IDA (iron deficiency anemia)    Idiopathic scoliosis of thoracolumbar spine    s/p  spinal arthrodesis T5 to L4 on  07/ 2005   PTSD (post-traumatic stress disorder)    Recurrent epistaxis    due to HHT, pt states very easily bleed's   Past Surgical History:  Procedure Laterality Date   LAPAROSCOPIC BILATERAL SALPINGECTOMY Bilateral 10/04/2020   Procedure: LAPAROSCOPIC BILATERAL SALPINGECTOMY;  Surgeon: Warden Fillers, MD;  Location: [facility];  Service: Gynecology;  Laterality: Bilateral;   LEEP N/A 10/04/2020   Procedure: LOOP ELECTROSURGICAL EXCISION PROCEDURE (LEEP) WITH COLPO;  Surgeon: Warden Fillers, MD;  Location: [facility];  Service: Gynecology;  Laterality: N/A;   OTHER SURGICAL HISTORY     Cardiac stent as child, pt unsure of exact procedure   POSTERIOR FUSION SPINAL DEFORMITY  07/20/2003   @[facility]  by dr ;   T5--9 lamenectomy's and arthrodesis T5  to L4   TONSILLECTOMY     child   The following portions of the patient's history were  reviewed and updated as appropriate: allergies, current medications, past family history, past medical history, past social history, past surgical history and problem list.   Health Maintenance:   Lab Results  Component Value Date   DIAGPAP (A) 07/12/2020    - High grade squamous intraepithelial lesion (HSIL)   HPVHIGH Positive (A) 07/12/2020   S/p LEEP at time of salpingectomy, neg margins, needs repeat pap 03/2020   Review of Systems:  Pertinent items noted in HPI and remainder of comprehensive ROS otherwise negative.  Physical Exam:    General:  Alert, oriented and cooperative.   Mental Status: Normal mood and affect perceived. Normal judgment and thought content.  Physical exam deferred due to nature of the encounter  Labs and Imaging No results found for this or any previous visit (from the past 336 hour(s)). No results found.    Assessment and Plan:     1. History of substance use disorder Patient struggling emotionally and experiencing strong cravings for opioids Start suboxone, 2-8 mg daily based on patients symptoms and she is OK with splitting films up If patient feels she needs more she will send a mychart message If she is having SI she is to go to the ED Also sent rx for narcan She will follow up with me in clinic later this month, UDS at that time - SUBOXONE 8-2 MG FILM; Place 1 Film under the tongue daily for 28 days.  Dispense: 28 each; Refill: 0 - naloxone (NARCAN) nasal spray 4 mg/0.1 mL; Use as needed for opioid overdose  Dispense: 2 each; Refill: 1       I discussed the assessment and treatment plan with the patient. The patient was provided an opportunity to ask questions and all were answered. The patient agreed with the plan and demonstrated an understanding of the instructions.   The patient was advised to call back or seek an in-person evaluation/go to the ED if the symptoms worsen or if the condition fails to improve as anticipated.  I provided 15 minutes of non-face-to-face time during this encounter.   Venora Maples, MD Center for [facility], [facility]

## 2020-11-13 ENCOUNTER — Ambulatory Visit: Payer: Medicaid Other | Admitting: Obstetrics and Gynecology

## 2020-11-17 ENCOUNTER — Ambulatory Visit: Payer: Medicaid Other | Admitting: Clinical

## 2020-11-17 DIAGNOSIS — F3162 Bipolar disorder, current episode mixed, moderate: Secondary | ICD-10-CM

## 2020-11-17 DIAGNOSIS — F4321 Adjustment disorder with depressed mood: Secondary | ICD-10-CM

## 2020-11-17 DIAGNOSIS — F4310 Post-traumatic stress disorder, unspecified: Secondary | ICD-10-CM

## 2020-11-17 DIAGNOSIS — Z87898 Personal history of other specified conditions: Secondary | ICD-10-CM

## 2020-11-17 NOTE — BH Specialist Note (Signed)
Integrated Behavioral Health via Telemedicine Visit  11/17/2020 Wanda Anderson [id number]  Number of Integrated Behavioral Health visits: 3 Session Start time: 8:50  Session End time: 9:16 Total time:  26  Referring Provider: Mariel Aloe, MD Patient/Family location: Home [facility] Provider location: Center for [facility] at [facility] for Women  All persons participating in visit: Patient Wanda Anderson and [facility] Wanda Anderson   Types of Service: Individual psychotherapy and Video visit  I connected with Wanda Anderson and/or Wanda Anderson's  n/a  via  Telephone or Video Enabled Telemedicine Application  (Video is Caregility application) and verified that I am speaking with the correct person using two identifiers. Discussed confidentiality: Yes   I discussed the limitations of telemedicine and the availability of in person appointments.  Discussed there is a possibility of technology failure and discussed alternative modes of communication if that failure occurs.  I discussed that engaging in this telemedicine visit, they consent to the provision of behavioral healthcare and the services will be billed under their insurance.  Patient and/or legal guardian expressed understanding and consented to Telemedicine visit: Yes   Presenting Concerns: Patient and/or family reports the following symptoms/concerns: Poor sleep since loss, inquiring about children's grief support and continuing refills of Zoloft and Suboxone.   Patient and/or Family's Strengths/Protective Factors: Concrete supports in place (healthy food, safe environments, etc.), Sense of purpose, and Physical Health (exercise, healthy diet, medication compliance, etc.)  Goals Addressed: Patient will:  Demonstrate ability to:  Continue healthy grieving over loss  Progress towards Goals: Ongoing  Interventions: Interventions utilized:  Medication Monitoring, Link to [employer], and Supportive  Reflection Standardized Assessments completed: Not Needed  Patient and/or Family Response: Pt agrees with treatment plan  Assessment: Patient currently experiencing Grief today  Patient may benefit from continued brief therapeutic intervention.  Plan: Follow up with behavioral health clinician on : Two weeks Behavioral recommendations:  -Continue taking Zoloft and Suboxone as prescribed -Continue allowing feelings of grief to come -Consider grief support resources for extra family support (on AVS) Referral(s): Integrated [employer] (In Clinic) and [employer]:  group grief support  I discussed the assessment and treatment plan with the patient and/or parent/guardian. They were provided an opportunity to ask questions and all were answered. They agreed with the plan and demonstrated an understanding of the instructions.   They were advised to call back or seek an in-person evaluation if the symptoms worsen or if the condition fails to improve as anticipated.  [name], LCSW  Depression screen [facility] 2/9 10/20/2020 10/18/2020 07/12/2020  Decreased Interest 0 0 0  Down, Depressed, Hopeless 0 0 0  PHQ - 2 Score 0 0 0  Altered sleeping 0 0 0  Tired, decreased energy 0 0 0  Change in appetite 0 0 0  Feeling bad or failure about yourself  0 0 0  Trouble concentrating 0 0 0  Moving slowly or fidgety/restless 0 0 0  Suicidal thoughts 0 0 0  PHQ-9 Score 0 0 0   GAD 7 : Generalized Anxiety Score 10/20/2020 10/18/2020 07/12/2020  Nervous, Anxious, on Edge 0 0 (No Data)  Control/stop worrying 0 0 (No Data)  Worry too much - different things 0 0 (No Data)  Trouble relaxing 0 0 0  Restless 0 0 0  Easily annoyed or irritable 0 0 1  Afraid - awful might happen 0 0 0  Total GAD 7 Score 0 0 -

## 2020-11-17 NOTE — Patient Instructions (Signed)
Center for [facility] at [facility] for Women [address] [place], [place] [address] [phone] (main office) [phone] (Hanson Medeiros's office)    [facility] (Individual and group grief support; Kids support called "[facility]") [email or web address]  [phone]

## 2020-11-24 NOTE — BH Specialist Note (Signed)
Integrated Behavioral Health via Telemedicine Visit  11/24/2020 MARINNA BLANE [id number]  Number of Integrated Behavioral Health visits: 4 Session Start time: 10:46  Session End time: 11:29 Total time:  35  Referring Provider: Mariel Aloe, MD Patient/Family location: Home [facility] Provider location: Center for [facility] at [facility]  All persons participating in visit: Patient Georgia Delsignore and [facility] Maquita Sandoval   Types of Service: Individual psychotherapy and Video visit  I connected with [name] and/or Doy Mince Sakata's  n/a  via  Telephone or Video Enabled Telemedicine Application  (Video is Caregility application) and verified that I am speaking with the correct person using two identifiers. Discussed confidentiality: Yes   I discussed the limitations of telemedicine and the availability of in person appointments.  Discussed there is a possibility of technology failure and discussed alternative modes of communication if that failure occurs.  I discussed that engaging in this telemedicine visit, they consent to the provision of behavioral healthcare and the services will be billed under their insurance.  Patient and/or legal guardian expressed understanding and consented to Telemedicine visit: Yes   Presenting Concerns: Patient and/or family reports the following symptoms/concerns: Adjusting to life after loss  Patient and/or Family's Strengths/Protective Factors: Social connections, Concrete supports in place (healthy food, safe environments, etc.), Sense of purpose, and Physical Health (exercise, healthy diet, medication compliance, etc.)  Goals Addressed: Patient will:  Reduce symptoms of: anxiety, depression, and stress    Demonstrate ability to:  Continue healthy grieving over loss  Progress towards Goals: Ongoing  Interventions: Interventions utilized:  Medication Monitoring and Supportive Reflection Standardized Assessments  completed: Not Needed  Patient and/or Family Response: Pt agrees with ongoing treatment plan  Assessment: Patient currently experiencing Grief today; history of Bipolar disorder and PTSD (both as previously diagnosed)  Patient may benefit from continued brief therapeutic interventions.  Plan: Follow up with behavioral health clinician on : Two weeks Behavioral recommendations:  -Continue taking Zoloft and Suboxone as prescribed -Continue plan for outing this weekend; cruise this winter -Use 24/7 BH service in [place] (on after visit summary) as needed Referral(s): Integrated [employer] (In Clinic)  I discussed the assessment and treatment plan with the patient and/or parent/guardian. They were provided an opportunity to ask questions and all were answered. They agreed with the plan and demonstrated an understanding of the instructions.   They were advised to call back or seek an in-person evaluation if the symptoms worsen or if the condition fails to improve as anticipated.  Valetta Close Halona Amstutz, LCSW

## 2020-12-06 ENCOUNTER — Encounter: Payer: Self-pay | Admitting: Family Medicine

## 2020-12-06 ENCOUNTER — Telehealth (INDEPENDENT_AMBULATORY_CARE_PROVIDER_SITE_OTHER): Payer: Medicaid Other | Admitting: Family Medicine

## 2020-12-06 DIAGNOSIS — Z87898 Personal history of other specified conditions: Secondary | ICD-10-CM

## 2020-12-06 MED ORDER — SUBOXONE 8-2 MG SL FILM: ORAL_FILM | SUBLINGUAL | 0 refills | Status: DC

## 2020-12-06 NOTE — Progress Notes (Signed)
GYNECOLOGY VIRTUAL VISIT ENCOUNTER NOTE  Provider location: Center for [facility]   Patient location: Home  I connected with Wanda Anderson on 12/06/20 at  8:15 AM EST by MyChart Video Encounter and verified that I am speaking with the correct person using two identifiers.   I discussed the limitations, risks, security and privacy concerns of performing an evaluation and management service virtually and the availability of in person appointments. I also discussed with the patient that there may be a patient responsible charge related to this service. The patient expressed understanding and agreed to proceed.   History:  Wanda Anderson is a 30 y.o. [phone] female being evaluated today for suboxone follow up.   Patient last seen on 11/10/20, at that time partner had died several days before and she was experiencing strong cravings to return to using opioids Started on suboxone with plan to self titrate to effective dose  Today reports she is taking between 8-12 mg depending on cravings, usually 4 mg BID though occasionally 8mg  AM and 4 mg PM More recently has been 12mg  more often due to many different stressors around the holidays and upcoming birthday of her deceased partner      Past Medical History:  Diagnosis Date   Alcohol abuse    10-02-2020 per pt can drink one bottle of wine daily  but mostly 2 bottles wine per week   Bipolar 1 disorder (HCC)    Family history of adverse reaction to anesthesia    per pt her father has anesthesia awareness, has to be given very high doses   HGSIL on Pap smear of cervix    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HCC)    followed by dr (hematology)   History of depression    History of drug abuse in remission [facility])    per pt in remission since 12/ 2016   Hx of arteriovenous malformation (AVM)    notated on Dr 06-14-1994 H&P for pt's spinal fusion , on prior to 2005, pt [facility], 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, s/p coil [facility] (finding on 2v rib/ cxr in care everywhere 09-01-2018 )   IDA (iron deficiency anemia)    Idiopathic scoliosis of thoracolumbar spine    s/p  spinal arthrodesis T5 to L4 on  07/ 2005   PTSD (post-traumatic stress disorder)    Recurrent epistaxis    due to HHT, pt states very easily bleed's   Past Surgical History:  Procedure Laterality Date   LAPAROSCOPIC BILATERAL SALPINGECTOMY Bilateral 10/04/2020   Procedure: LAPAROSCOPIC BILATERAL SALPINGECTOMY;  Surgeon: 2006, MD;  Location: [facility];  Service: Gynecology;  Laterality: Bilateral;   LEEP N/A 10/04/2020   Procedure: LOOP ELECTROSURGICAL EXCISION PROCEDURE (LEEP) WITH COLPO;  Surgeon: [facility] MD;  Location: [facility];  Service: Gynecology;  Laterality: N/A;   OTHER SURGICAL HISTORY     Cardiac stent as child, pt unsure of exact procedure   POSTERIOR FUSION SPINAL DEFORMITY  07/20/2003   @[facility]  by dr [facility]   T5--9 lamenectomy's and arthrodesis T5  to L4   TONSILLECTOMY     child   The following portions of the patient's history were reviewed and updated as appropriate: allergies, current medications, past family history, past medical history, past social history, past surgical history and problem list.   Health Maintenance:  Lab Results  Component  Value Date   DIAGPAP (A) 07/12/2020    - High grade squamous intraepithelial lesion (HSIL)   HPVHIGH Positive (A) 07/12/2020   S/p LEEP on 10/04/20 with Dr. 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  Physical Exam:   General:  Alert, oriented and cooperative. Patient appears to be in no acute distress.  Mental Status: Normal mood and affect. Normal behavior. Normal judgment and thought content.    Respiratory: Normal respiratory effort, no problems with respiration noted  Rest of physical exam deferred due to type of encounter  Labs and Imaging No results found for this or any previous visit (from the past 336 hour(s)). No results found.     Assessment and Plan:     1. History of substance use disorder Doing well after suboxone initiation Sent refill for 12mg  daily Stressed we will need to see patient at next visit to perform UDS - SUBOXONE 8-2 MG FILM; Take one and a half films daily  Dispense: 42 each; Refill: 0       I discussed the assessment and treatment plan with the patient. The patient was provided an opportunity to ask questions and all were answered. The patient agreed with the plan and demonstrated an understanding of the instructions.   The patient was advised to call back or seek an in-person evaluation/go to the ED if the symptoms worsen or if the condition fails to improve as anticipated.  I provided 10 minutes of face-to-face time during this encounter.   , MD Center for [facility], [facility]

## 2020-12-07 DIAGNOSIS — Z419 Encounter for procedure for purposes other than remedying health state, unspecified: Secondary | ICD-10-CM | POA: Diagnosis not present

## 2020-12-08 ENCOUNTER — Ambulatory Visit: Payer: Medicaid Other | Admitting: Clinical

## 2020-12-08 DIAGNOSIS — F4321 Adjustment disorder with depressed mood: Secondary | ICD-10-CM

## 2020-12-08 DIAGNOSIS — F4310 Post-traumatic stress disorder, unspecified: Secondary | ICD-10-CM

## 2020-12-08 DIAGNOSIS — F3162 Bipolar disorder, current episode mixed, moderate: Secondary | ICD-10-CM

## 2020-12-08 DIAGNOSIS — Z87898 Personal history of other specified conditions: Secondary | ICD-10-CM

## 2020-12-08 NOTE — Patient Instructions (Addendum)
24/7 Behavioral Health Crisis Centers by [place]:  [facility]: [facility] (24/7): [phone]   [address] [place]cy [place], [place] [address] - 24 Hour Crisis Line: 1 [phone]  [facility] (Individual and group grief support; [facility]) [email or web address]  [phone]

## 2020-12-12 NOTE — BH Specialist Note (Signed)
Integrated Behavioral Health via Telemedicine Visit  12/12/2020 MOMINA HUNTON [id number]  Number of Integrated Behavioral Health visits: 5 Session Start time: 10:58  Session End time: 11:19 Total time:  21  Referring Provider: Mariel Aloe, MD Patient/Family location: Home [facility] Provider location: Center for [facility] at [facility] for Women  All persons participating in visit: Patient Wanda Anderson and [facility]   Types of Service: Individual psychotherapy and Video visit  I connected with [name] and/or Doy Mince Brizuela's  n/a  via  Telephone or Video Enabled Telemedicine Application  (Video is Caregility application) and verified that I am speaking with the correct person using two identifiers. Discussed confidentiality: Yes   I discussed the limitations of telemedicine and the availability of in person appointments.  Discussed there is a possibility of technology failure and discussed alternative modes of communication if that failure occurs.  I discussed that engaging in this telemedicine visit, they consent to the provision of behavioral healthcare and the services will be billed under their insurance.  Patient and/or legal guardian expressed understanding and consented to Telemedicine visit: Yes   Presenting Concerns: Patient and/or family reports the following symptoms/concerns: Taking BH medication as prescribed, starts new job first week January; pt staying with supportive mother temporarily has been helpful in managing grief and emotions.   Patient and/or Family's Strengths/Protective Factors: Social connections, Concrete supports in place (healthy food, safe environments, etc.), Sense of purpose, and Physical Health (exercise, healthy diet, medication compliance, etc.)  Goals Addressed: Patient will:  Reduce symptoms of: anxiety, depression, and obsessions   Demonstrate ability to: Increase healthy adjustment to current life  circumstances  Progress towards Goals: Ongoing  Interventions: Interventions utilized:  Medication Monitoring and Supportive Reflection Standardized Assessments completed: Not Needed  Patient and/or Family Response: Pt agrees with treatment plan  Assessment: Patient currently experiencing Grief, History of Bipolar disorder and PTSD.   Patient may benefit from brief therapeutic interventions today.  Plan: Follow up with behavioral health clinician on : Three weeks Behavioral recommendations: -Continue taking Zoloft and Suboxone as prescribed -Continue surrounding self with supportive family and friends; enjoy time with mother and children today -Continue with plan to use 24/7 BH service in [facility], as needed and discussed at previous visit  Referral(s): Integrated [employer] (In Clinic)  I discussed the assessment and treatment plan with the patient and/or parent/guardian. They were provided an opportunity to ask questions and all were answered. They agreed with the plan and demonstrated an understanding of the instructions.   They were advised to call back or seek an in-person evaluation if the symptoms worsen or if the condition fails to improve as anticipated.  [name], LCSW  Depression screen [facility] 2/9 10/20/2020 10/18/2020 07/12/2020  Decreased Interest 0 0 0  Down, Depressed, Hopeless 0 0 0  PHQ - 2 Score 0 0 0  Altered sleeping 0 0 0  Tired, decreased energy 0 0 0  Change in appetite 0 0 0  Feeling bad or failure about yourself  0 0 0  Trouble concentrating 0 0 0  Moving slowly or fidgety/restless 0 0 0  Suicidal thoughts 0 0 0  PHQ-9 Score 0 0 0   GAD 7 : Generalized Anxiety Score 10/20/2020 10/18/2020 07/12/2020  Nervous, Anxious, on Edge 0 0 (No Data)  Control/stop worrying 0 0 (No Data)  Worry too much - different things 0 0 (No Data)  Trouble relaxing 0 0 0  Restless  0 0 0  Easily annoyed or irritable 0 0 1  Afraid - awful might  happen 0 0 0  Total GAD 7 Score 0 0 -

## 2020-12-25 ENCOUNTER — Ambulatory Visit (INDEPENDENT_AMBULATORY_CARE_PROVIDER_SITE_OTHER): Payer: Medicaid Other | Admitting: Clinical

## 2020-12-25 DIAGNOSIS — F4321 Adjustment disorder with depressed mood: Secondary | ICD-10-CM

## 2020-12-25 DIAGNOSIS — F3162 Bipolar disorder, current episode mixed, moderate: Secondary | ICD-10-CM

## 2020-12-25 DIAGNOSIS — F4310 Post-traumatic stress disorder, unspecified: Secondary | ICD-10-CM

## 2021-01-02 ENCOUNTER — Other Ambulatory Visit: Payer: Self-pay | Admitting: Family Medicine

## 2021-01-02 DIAGNOSIS — Z87898 Personal history of other specified conditions: Secondary | ICD-10-CM

## 2021-01-02 MED ORDER — SUBOXONE 8-2 MG SL FILM: ORAL_FILM | SUBLINGUAL | 0 refills | Status: DC

## 2021-01-02 NOTE — Progress Notes (Signed)
Short term refill sent due to needing to reschedule visit

## 2021-01-02 NOTE — BH Specialist Note (Signed)
Integrated Behavioral Health via Telemedicine Visit  01/02/2021 Wanda Anderson [id number]  Number of Integrated Behavioral Health visits: 6 Session Start time: 10:49  Session End time: 11:00 Total time:  11  Referring Provider: Mariel Aloe, MD Wanda/Family location: In car with her mother, [facility] Provider location: Center for [employer] [facility] [employer] for Women  All persons participating in visit: Wanda Anderson and [facility] Keeon Zurn   Types of Service: Individual psychotherapy and Video visit  I connected with [name] and/or Doy Mince Stracener's  n/a  via  Telephone or Video Enabled Telemedicine Application  (Video is Caregility application) and verified that I am speaking with the correct person using two identifiers. Discussed confidentiality: Yes   I discussed the limitations of telemedicine and the availability of in person appointments.  Discussed there is a possibility of technology failure and discussed alternative modes of communication if that failure occurs.  I discussed that engaging in this telemedicine visit, they consent to the provision of behavioral healthcare and the services will be billed under their insurance.  Wanda and/or legal guardian expressed understanding and consented to Telemedicine visit: Yes   Presenting Concerns: Wanda and/or family reports the following symptoms/concerns: Pt is feeling good today, spending time with her mother; agrees to CCA at next scheduled visit; no noticed side effects with increased Zoloft; taking medications as prescribed.  Duration of problem: Ongoing  Wanda and/or Family's Strengths/Protective Factors: Social connections, Concrete supports in place (healthy food, safe environments, etc.), Sense of purpose, and Physical Health (exercise, healthy diet, medication compliance, etc.)  Goals Addressed: Wanda will:  Reduce symptoms of: anxiety and depression   Progress towards  Goals: Ongoing  Interventions: Interventions utilized:  Supportive Reflection Standardized Assessments completed: Not Needed  Wanda and/or Family Response: Pt agrees with ongoing treatment plan; prefers no referral to outside agency  Assessment: Wanda currently experiencing Grief, PTSD; history of Bipolar disorder.   Wanda may benefit from continued brief therapeutic interventions.  Plan: Follow up with behavioral health clinician on : Two weeks for Comprehensive Clinical Assessment (CCA) Behavioral recommendations:  -Continue taking BH medication as prescribed (Zoloft; Suboxone) -Continue with plan to spend time with supportive family and friends in the next two weeks, as much as able Referral(s): Integrated [employer] (In Clinic)  I discussed the assessment and treatment plan with the Wanda and/or parent/guardian. They were provided an opportunity to ask questions and all were answered. They agreed with the plan and demonstrated an understanding of the instructions.   They were advised to call back or seek an in-person evaluation if the symptoms worsen or if the condition fails to improve as anticipated.  [name], LCSW  Depression screen [facility] 2/9 01/09/2021 10/20/2020 10/18/2020 07/12/2020  Decreased Interest 1 0 0 0  Down, Depressed, Hopeless 1 0 0 0  PHQ - 2 Score 2 0 0 0  Altered sleeping 1 0 0 0  Tired, decreased energy 1 0 0 0  Change in appetite 1 0 0 0  Feeling bad or failure about yourself  1 0 0 0  Trouble concentrating 1 0 0 0  Moving slowly or fidgety/restless 1 0 0 0  Suicidal thoughts 0 0 0 0  PHQ-9 Score 8 0 0 0   GAD 7 : Generalized Anxiety Score 01/09/2021 10/20/2020 10/18/2020 07/12/2020  Nervous, Anxious, on Edge 3 0 0 (No Data)  Control/stop worrying 3 0 0 (No Data)  Worry too much - different things 2  0 0 (No Data)  Trouble relaxing 3 0 0 0  Restless 1 0 0 0  Easily annoyed or irritable 2 0 0 1  Afraid - awful might happen 3  0 0 0  Total GAD 7 Score 17 0 0 -

## 2021-01-03 ENCOUNTER — Ambulatory Visit: Payer: Medicaid Other | Admitting: Family Medicine

## 2021-01-07 DIAGNOSIS — Z419 Encounter for procedure for purposes other than remedying health state, unspecified: Secondary | ICD-10-CM | POA: Diagnosis not present

## 2021-01-09 ENCOUNTER — Ambulatory Visit: Payer: Medicaid Other | Admitting: Clinical

## 2021-01-09 ENCOUNTER — Other Ambulatory Visit: Payer: Self-pay

## 2021-01-09 ENCOUNTER — Encounter: Payer: Self-pay | Admitting: Family Medicine

## 2021-01-09 ENCOUNTER — Ambulatory Visit (INDEPENDENT_AMBULATORY_CARE_PROVIDER_SITE_OTHER): Payer: Medicaid Other | Admitting: Family Medicine

## 2021-01-09 DIAGNOSIS — R21 Rash and other nonspecific skin eruption: Secondary | ICD-10-CM | POA: Diagnosis not present

## 2021-01-09 DIAGNOSIS — Z87898 Personal history of other specified conditions: Secondary | ICD-10-CM

## 2021-01-09 DIAGNOSIS — F3162 Bipolar disorder, current episode mixed, moderate: Secondary | ICD-10-CM

## 2021-01-09 DIAGNOSIS — Z8659 Personal history of other mental and behavioral disorders: Secondary | ICD-10-CM | POA: Diagnosis not present

## 2021-01-09 DIAGNOSIS — F418 Other specified anxiety disorders: Secondary | ICD-10-CM

## 2021-01-09 DIAGNOSIS — R87613 High grade squamous intraepithelial lesion on cytologic smear of cervix (HGSIL): Secondary | ICD-10-CM | POA: Diagnosis not present

## 2021-01-09 DIAGNOSIS — F4321 Adjustment disorder with depressed mood: Secondary | ICD-10-CM

## 2021-01-09 DIAGNOSIS — F4310 Post-traumatic stress disorder, unspecified: Secondary | ICD-10-CM

## 2021-01-09 MED ORDER — TRIAMCINOLONE ACETONIDE 0.1 % EX CREA: 1.0000 "application " | TOPICAL_CREAM | Freq: Two times a day (BID) | CUTANEOUS | 0 refills | Status: AC

## 2021-01-09 MED ORDER — SUBOXONE 8-2 MG SL FILM: ORAL_FILM | SUBLINGUAL | 0 refills | Status: DC

## 2021-01-09 MED ORDER — SERTRALINE HCL 100 MG PO TABS: 100.0000 mg | ORAL_TABLET | Freq: Every day | ORAL | 3 refills | Status: DC

## 2021-01-09 NOTE — Assessment & Plan Note (Signed)
Discussed need for 6 month post procedure follow up pap in March

## 2021-01-09 NOTE — Assessment & Plan Note (Signed)
Stable on 12 mg Going on cruise at end of Jan/beginning of Feb Given 60 day supply, will see her after she gets back UDS obtained today

## 2021-01-09 NOTE — Assessment & Plan Note (Signed)
Sad but stable, very engaged in [facility] care with [name] Recommended increasing sertraline from 50>100mg  daily, she is in agreement Also discussed [facility] services across the street PRN if this is not helping

## 2021-01-09 NOTE — BH Specialist Note (Signed)
Integrated Behavioral Health Follow Up In-Person Visit  MRN: [id number] Name: Wanda Anderson  Less than 15 minute joint visit for emotional support due to conflict with pt's father, in the midst of continued grief; pt agrees to follow up virtual visit on 01/16/20.   [name], LCSW  Depression screen [facility] 2/9 01/09/2021 10/20/2020 10/18/2020 07/12/2020  Decreased Interest 1 0 0 0  Down, Depressed, Hopeless 1 0 0 0  PHQ - 2 Score 2 0 0 0  Altered sleeping 1 0 0 0  Tired, decreased energy 1 0 0 0  Change in appetite 1 0 0 0  Feeling bad or failure about yourself  1 0 0 0  Trouble concentrating 1 0 0 0  Moving slowly or fidgety/restless 1 0 0 0  Suicidal thoughts 0 0 0 0  PHQ-9 Score 8 0 0 0   GAD 7 : Generalized Anxiety Score 01/09/2021 10/20/2020 10/18/2020 07/12/2020  Nervous, Anxious, on Edge 3 0 0 (No Data)  Control/stop worrying 3 0 0 (No Data)  Worry too much - different things 2 0 0 (No Data)  Trouble relaxing 3 0 0 0  Restless 1 0 0 0  Easily annoyed or irritable 2 0 0 1  Afraid - awful might happen 3 0 0 0  Total GAD 7 Score 17 0 0 -

## 2021-01-09 NOTE — Assessment & Plan Note (Signed)
Eczematous periumbilical rash, patient reports discharge but none seen on exam. No evidence of infection on exam. Unclear etiology. Trial steroid cream, will need further workup if not effective.

## 2021-01-09 NOTE — Progress Notes (Signed)
GYNECOLOGY OFFICE VISIT NOTE  History:   Wanda Anderson is a 31 y.o. [phone] here today for suboxone follow up.  Patient very upset on arrival, saw [name], see BH note  On my interview patient reports very difficult relationship with her father He works nights and she spends all day trying to be as quiet as possible in her room so as not to wake him He does not seem interested in a relationship with her  Regarding her suboxone she reports she is doing well Taking 12mg  daily and does not feel her dose needs an adjustment Denies any other substance use Ok with UDS  Also worried about her belly button Has had an irritating rash there for several days Has some foul smelling discharge Tried to put hydrogen peroxide on it but it did not help, only tried that once  Health Maintenance Due  Topic Date Due   COVID-19 Vaccine (1) Never done   Hepatitis C Screening  Never done   TETANUS/TDAP  Never done   INFLUENZA VACCINE  Never done    Past Medical History:  Diagnosis Date   Alcohol abuse    10-02-2020 per pt can drink one bottle of wine daily  but mostly 2 bottles wine per week   Bipolar 1 disorder (HCC)    Family history of adverse reaction to anesthesia    per pt her father has anesthesia awareness, has to be given very high doses   HGSIL on Pap smear of cervix    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HCC)    followed by dr 10-04-2020 (hematology)   History of depression    History of drug abuse in remission [facility])    per pt in remission since 12/ 2016   Hx of arteriovenous malformation (AVM)    notated on Dr 2017 H&P for pt's spinal fusion , on prior to 2005, pt had evaulation by [facility], 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, s/p coil procedure at [facility] (finding on 2v rib/ cxr in care everywhere 09-01-2018 )   IDA (iron deficiency anemia)    Idiopathic scoliosis of thoracolumbar spine    s/p  spinal arthrodesis T5 to L4 on   07/ 2005   PTSD (post-traumatic stress disorder)    Recurrent epistaxis    due to HHT, pt states very easily bleed's    Past Surgical History:  Procedure Laterality Date   LAPAROSCOPIC BILATERAL SALPINGECTOMY Bilateral 10/04/2020   Procedure: LAPAROSCOPIC BILATERAL SALPINGECTOMY;  Surgeon: 10/06/2020, MD;  Location: [facility];  Service: Gynecology;  Laterality: Bilateral;   LEEP N/A 10/04/2020   Procedure: LOOP ELECTROSURGICAL EXCISION PROCEDURE (LEEP) WITH COLPO;  Surgeon: 10/06/2020, MD;  Location: [facility];  Service: Gynecology;  Laterality: N/A;   OTHER SURGICAL HISTORY     Cardiac stent as child, pt unsure of exact procedure   POSTERIOR FUSION SPINAL DEFORMITY  07/20/2003   @[facility]  by dr 07/22/2003;   T5--9 lamenectomy's and arthrodesis T5  to L4   TONSILLECTOMY     child    The following portions of the patient's history were reviewed and updated as appropriate: allergies, current medications, past family history, past medical history, past social history, past surgical history and problem list.   Health Maintenance:   Last pap: Lab Results  Component Value Date   DIAGPAP (A) 07/12/2020    - High grade squamous intraepithelial lesion (HSIL)   HPVHIGH Positive (A) 07/12/2020  S/p LEEP on 10/04/20 with Dr. Donavan Foil, CIN 2-3 pathology with negative margins, repeat pap 03/2020   Last mammogram:  N/a    Review of Systems:  Pertinent items noted in HPI and remainder of comprehensive ROS otherwise negative.  Physical Exam:  There were no vitals taken for this visit. CONSTITUTIONAL: Well-developed, well-nourished female in no acute distress.  HEENT:  Normocephalic, atraumatic. External right and left ear normal. No scleral icterus.  NECK: Normal range of motion, supple, no masses noted on observation SKIN: eczematous rash around belly button, no erythema or induration or discharge noted MUSCULOSKELETAL: Normal range of motion. No edema  noted. NEUROLOGIC: Alert and oriented to person, place, and time. Normal muscle tone coordination.  PSYCHIATRIC: sad mood and affect RESPIRATORY: Effort normal, no problems with respiration noted   Labs and Imaging No results found for this or any previous visit (from the past 168 hour(s)). No results found.    Assessment and Plan:   Problem List Items Addressed This Visit       Musculoskeletal and Integument   Rash    Eczematous periumbilical rash, patient reports discharge but none seen on exam. No evidence of infection on exam. Unclear etiology. Trial steroid cream, will need further workup if not effective.       Relevant Medications   triamcinolone cream (KENALOG) 0.1 %     Other   High grade squamous intraepithelial cervical dysplasia    Discussed need for 6 month post procedure follow up pap in March      Depression with anxiety    Sad but stable, very engaged in [facility] care with [name] Recommended increasing sertraline from 50>100mg  daily, she is in agreement Also discussed [facility] services across the street PRN if this is not helping      Relevant Medications   sertraline (ZOLOFT) 100 MG tablet   History of substance use disorder - Primary    Stable on 12 mg Going on cruise at end of Jan/beginning of Feb Given 60 day supply, will see her after she gets back UDS obtained today      Relevant Medications   SUBOXONE 8-2 MG FILM   Other Relevant Orders   ToxASSURE Select 13 (MW), Urine    Routine preventative health maintenance measures emphasized. Please refer to After Visit Summary for other counseling recommendations.   Return in about 6 weeks (around 02/20/2021) for suboxone follow up.    Total face-to-face time with patient: 20 minutes.  Over 50% of encounter was spent on counseling and coordination of care.   Venora Maples, MD/MPH Attending Family Medicine Physician, [facility] for [facility], [facility]

## 2021-01-11 LAB — TOXASSURE SELECT 13 (MW), URINE

## 2021-01-15 ENCOUNTER — Ambulatory Visit: Payer: Medicaid Other | Admitting: Clinical

## 2021-01-15 DIAGNOSIS — F4310 Post-traumatic stress disorder, unspecified: Secondary | ICD-10-CM

## 2021-01-15 DIAGNOSIS — F3162 Bipolar disorder, current episode mixed, moderate: Secondary | ICD-10-CM

## 2021-01-15 DIAGNOSIS — F4321 Adjustment disorder with depressed mood: Secondary | ICD-10-CM
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
## 2021-01-29 ENCOUNTER — Ambulatory Visit (INDEPENDENT_AMBULATORY_CARE_PROVIDER_SITE_OTHER): Payer: Medicaid Other | Admitting: Clinical

## 2021-01-29 DIAGNOSIS — F4310 Post-traumatic stress disorder, unspecified: Secondary | ICD-10-CM | POA: Diagnosis not present

## 2021-01-29 DIAGNOSIS — F3162 Bipolar disorder, current episode mixed, moderate: Secondary | ICD-10-CM

## 2021-01-29 DIAGNOSIS — F4321 Adjustment disorder with depressed mood: Secondary | ICD-10-CM

## 2021-02-07 DIAGNOSIS — Z419 Encounter for procedure for purposes other than remedying health state, unspecified: Secondary | ICD-10-CM | POA: Diagnosis not present

## 2021-02-07 NOTE — BH Specialist Note (Signed)
Integrated Behavioral Health via Telemedicine Visit  02/07/2021 MARSHEA WISHER [id number]  Number of Integrated Behavioral Health visits: 8 Session Start time: 10:57  Session End time: 11:21 Total time:  24  Referring Provider: Mariel Aloe, MD Patient/Family location: Home [facility] Provider location: Center for [facility]  All persons participating in visit: Patient Wanda Anderson and [facility] Santoria Chason   Types of Service: Individual psychotherapy and Telephone visit  I connected with [name] and/or Doy Mince Landau's mother via  Telephone or Video Enabled Telemedicine Application  (Video is Caregility application) and verified that I am speaking with the correct person using two identifiers. Discussed confidentiality: Yes   I discussed the limitations of telemedicine and the availability of in person appointments.  Discussed there is a possibility of technology failure and discussed alternative modes of communication if that failure occurs.  I discussed that engaging in this telemedicine visit, they consent to the provision of behavioral healthcare and the services will be billed under their insurance.  Patient and/or legal guardian expressed understanding and consented to Telemedicine visit: Yes   Presenting Concerns: Patient and/or family reports the following symptoms/concerns: Ongoing life stress, with renewed motivation to work on personal life goals after returning from a vacation with her mother (obtain driver's license, save money for her own home in a better school district for her children). Pt taking Suboxone at night (too sleepy if taken in morning); requests to begin Depakote again.  Duration of problem: Ongoing; Severity of problem: severe  Patient and/or Family's Strengths/Protective Factors: Social connections, Concrete supports in place (healthy food, safe environments, etc.), Sense of purpose, and Physical Health  (exercise, healthy diet, medication compliance, etc.)  Goals Addressed: Patient will:  Reduce symptoms of: anxiety, depression, mood instability, and stress    Demonstrate ability to: Increase healthy adjustment to current life circumstances and Increase motivation to adhere to plan of care  Progress towards Goals: Ongoing  Interventions: Interventions utilized:  Motivational Interviewing and Solution-Focused Strategies Standardized Assessments completed:  GIven in past two weeks: PHQ9/GAD7  Patient and/or Family Response: Pt agrees with current treatment plan; requests additional medication for mood/will agree to psychiatry after [facility] talks to medical provider  Assessment: Patient currently experiencing PTSD, Bipolar disorder, Grief.   Patient may benefit from continued brief therapeutic interventions.  Plan: Follow up with behavioral health clinician on : Two weeks Behavioral recommendations:  -Continue taking medications as currently prescribed; will receive call-back about any changes to medication -Continue step towards personal life goal: 03/06/21 DMV appointment for written and vision test -Continue with plan to attend Social security appointment 02/21/21 -Continue plan to research apartment prices and locations, as time permits, to see options available Referral(s): Integrated [employer] (In Clinic)  I discussed the assessment and treatment plan with the patient and/or parent/guardian. They were provided an opportunity to ask questions and all were answered. They agreed with the plan and demonstrated an understanding of the instructions.   They were advised to call back or seek an in-person evaluation if the symptoms worsen or if the condition fails to improve as anticipated.  [name], LCSW  Depression screen [facility] 2/9 01/29/2021 01/09/2021 10/20/2020 10/18/2020 07/12/2020  Decreased Interest 1 1 0 0 0  Down, Depressed, Hopeless 3 1 0 0 0  PHQ - 2 Score 4  2 0 0 0  Altered sleeping 3 1 0 0 0  Tired, decreased energy 3 1 0 0 0  Change  in appetite 1 1 0 0 0  Feeling bad or failure about yourself  3 1 0 0 0  Trouble concentrating 0 1 0 0 0  Moving slowly or fidgety/restless 1 1 0 0 0  Suicidal thoughts 0 0 0 0 0  PHQ-9 Score 15 8 0 0 0   GAD 7 : Generalized Anxiety Score 01/29/2021 01/09/2021 10/20/2020 10/18/2020  Nervous, Anxious, on Edge 3 3 0 0  Control/stop worrying 3 3 0 0  Worry too much - different things 3 2 0 0  Trouble relaxing 3 3 0 0  Restless 1 1 0 0  Easily annoyed or irritable 3 2 0 0  Afraid - awful might happen 3 3 0 0  Total GAD 7 Score 19 17 0 0

## 2021-02-15 ENCOUNTER — Ambulatory Visit (INDEPENDENT_AMBULATORY_CARE_PROVIDER_SITE_OTHER): Payer: Medicaid Other | Admitting: Clinical

## 2021-02-15 DIAGNOSIS — F4310 Post-traumatic stress disorder, unspecified: Secondary | ICD-10-CM

## 2021-02-15 DIAGNOSIS — F3162 Bipolar disorder, current episode mixed, moderate: Secondary | ICD-10-CM

## 2021-02-18 ENCOUNTER — Encounter: Payer: Self-pay | Admitting: Family Medicine

## 2021-02-19 NOTE — BH Specialist Note (Signed)
Integrated Behavioral Health via Telemedicine Visit  03/02/2021 Wanda Anderson [id number]  Number of Integrated Behavioral Health Clinician visits: Additional Visit  Session Start time: 1051   Session End time: 1107  Total time in minutes: 16   Referring Provider: Mariel Aloe, MD Patient/Family location: [place], [place] [facility] Provider location: Center for [employer] at [facility] for Women  All persons participating in visit: Patient Wanda Anderson and [facility] Johnchristopher Sarvis   Types of Service: Individual psychotherapy and Telephone visit  I connected with [name] and/or [name]  n/a  via  Telephone or Video Enabled Telemedicine Application  (Video is Caregility application) and verified that I am speaking with the correct person using two identifiers. Discussed confidentiality: Yes   I discussed the limitations of telemedicine and the availability of in person appointments.  Discussed there is a possibility of technology failure and discussed alternative modes of communication if that failure occurs.  I discussed that engaging in this telemedicine visit, they consent to the provision of behavioral healthcare and the services will be billed under their insurance.  Patient and/or legal guardian expressed understanding and consented to Telemedicine visit: Yes   Presenting Concerns: Patient and/or family reports the following symptoms/concerns: Pt taking Zoloft as prescribed, feeling no difference; requesting mood stabilizer, specifically Depakote; life stress with recent move to [facility] with birth mother and seeking local housing.  Duration of problem: Ongong; Severity of problem: severe  Patient and/or Family's Strengths/Protective Factors: Social connections, Sense of purpose, and Physical Health (exercise, healthy diet, medication compliance, etc.)  Goals Addressed: Patient will:  Reduce symptoms of: anxiety, depression, mood  instability, and stress   Progress towards Goals: Ongoing  Interventions: Interventions utilized:  Solution-Focused Strategies Standardized Assessments completed: GAD-7 and PHQ 9  Patient and/or Family Response: Pt agrees with treatment plan  Assessment: Patient currently experiencing PTSD, Bipolar disorder, Grief.   Patient may benefit from continued brief therapeutic interventions .  Plan: Follow up with behavioral health clinician on : Two weeks; Call [name] at [phone], as needed. -Continue taking BH medication as prescribed -Continue with plan to attend [facility] appointment on 03/06/21 for written and vision test  -Continue search for independent housing -Consider additional resources for [facility] care in [facility], if staying in that county Referral(s): Integrated [employer] (In Clinic) and [employer] [facility] (LME/Outside Clinic)  I discussed the assessment and treatment plan with the patient and/or parent/guardian. They were provided an opportunity to ask questions and all were answered. They agreed with the plan and demonstrated an understanding of the instructions.   They were advised to call back or seek an in-person evaluation if the symptoms worsen or if the condition fails to improve as anticipated.  [name], LCSW  Depression screen [facility] 2/9 03/02/2021 01/29/2021 01/09/2021 10/20/2020 10/18/2020  Decreased Interest 1 1 1  0 0  Down, Depressed, Hopeless 2 3 1  0 0  PHQ - 2 Score 3 4 2  0 0  Altered sleeping 3 3 1  0 0  Tired, decreased energy 3 3 1  0 0  Change in appetite 3 1 1  0 0  Feeling bad or failure about yourself  3 3 1  0 0  Trouble concentrating 0 0 1 0 0  Moving slowly or fidgety/restless 1 1 1  0 0  Suicidal thoughts 0 0 0 0 0  PHQ-9 Score 16 15 8  0 0   GAD 7 : Generalized Anxiety Score 03/02/2021 01/29/2021 01/09/2021 10/20/2020  Nervous, Anxious, on Edge 3 3 3  0  Control/stop worrying 3 3 3  0  Worry too much - different things  3 3 2  0  Trouble relaxing 1 3 3  0  Restless 0 1 1 0  Easily annoyed or irritable 3 3 2  0  Afraid - awful might happen 3 3 3  0  Total GAD 7 Score 16 19 17  0

## 2021-02-20 ENCOUNTER — Encounter: Payer: Self-pay | Admitting: Family Medicine

## 2021-02-20 ENCOUNTER — Telehealth (INDEPENDENT_AMBULATORY_CARE_PROVIDER_SITE_OTHER): Payer: Medicaid Other | Admitting: Family Medicine

## 2021-02-20 DIAGNOSIS — Z8659 Personal history of other mental and behavioral disorders: Secondary | ICD-10-CM | POA: Diagnosis not present

## 2021-02-20 DIAGNOSIS — F418 Other specified anxiety disorders: Secondary | ICD-10-CM | POA: Diagnosis not present

## 2021-02-20 DIAGNOSIS — Z87898 Personal history of other specified conditions: Secondary | ICD-10-CM | POA: Diagnosis not present

## 2021-02-20 MED ORDER — SERTRALINE HCL 100 MG PO TABS: 150.0000 mg | ORAL_TABLET | Freq: Every day | ORAL | 5 refills | Status: AC

## 2021-02-20 MED ORDER — SUBOXONE 8-2 MG SL FILM: ORAL_FILM | SUBLINGUAL | 0 refills | Status: DC

## 2021-02-20 NOTE — Progress Notes (Signed)
GYNECOLOGY VIRTUAL VISIT ENCOUNTER NOTE  Provider location: Center for [place] [facility] [place] [facility]   Patient location: Home  I connected with Wanda Anderson on 02/20/21 at  1:15 PM EST by MyChart Video Encounter and verified that I am speaking with the correct person using two identifiers.   I discussed the limitations, risks, security and privacy concerns of performing an evaluation and management service virtually and the availability of in person appointments. I also discussed with the patient that there may be a patient responsible charge related to this service. The patient expressed understanding and agreed to proceed.   History:  Wanda Anderson is a 31 y.o. [phone] female being evaluated today for suboxone follow up.  Doing well on 8-12 mg daily Unable to come to clinic today due to car troubles  Rash from previous visit has resolved  Went on a cruise since last visit, had a great time Feels like it was the first time she could smile since her partner died However today is valentines day and she is having a hard time Zoloft increased at last visit, somewhat helpful but not completely      Past Medical History:  Diagnosis Date   Alcohol abuse    10-02-2020 per pt can drink one bottle of wine daily  but mostly 2 bottles wine per week   Bipolar 1 disorder ([place])    Family history of adverse reaction to anesthesia    per pt her father has anesthesia awareness, has to be given very high doses   HGSIL on Pap smear of cervix    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[place])    followed by dr Marin Olp (hematology)   History of depression    History of drug abuse in remission [facility])    per pt in remission since 12/ 2016   Hx of arteriovenous malformation (AVM)    notated on Dr Patrice Paradise H&P for pt's spinal fusion , on prior to 2005, pt had evaulation by [facility], 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, s[facility]p [facility] (finding on 2v rib/ cxr in care everywhere 09-01-2018 )   IDA (iron deficiency anemia)    Idiopathic scoliosis of thoracolumbar spine    s/p  spinal arthrodesis T5 to L4 on  07/ 2005   PTSD (post-traumatic stress disorder)    Recurrent epistaxis    due to HHT, pt states very easily bleed's   Past Surgical History:  Procedure Laterality Date   LAPAROSCOPIC BILATERAL SALPINGECTOMY Bilateral 10/04/2020   Procedure: LAPAROSCOPIC BILATERAL SALPINGECTOMY;  Surgeon: Griffin Basil, MD;  Location: [facility];  Service: Gynecology;  Laterality: Bilateral;   LEEP N/A 10/04/2020   Procedure: LOOP ELECTROSURGICAL EXCISION PROCEDURE (LEEP) WITH COLPO;  Surgeon: Griffin Basil, MD;  Location: [facility];  Service: Gynecology;  Laterality: N/A;   OTHER SURGICAL HISTORY     Cardiac stent as child, pt unsure of exact procedure   POSTERIOR FUSION SPINAL DEFORMITY  07/20/2003   @[facility]  by dr Patrice Paradise;   T5--9 lamenectomy's and arthrodesis T5  to L4   TONSILLECTOMY     child   The following portions of the patient's history were reviewed and updated as appropriate: allergies, current medications, past family history, past medical history, past social history, past surgical history and problem list.   Health Maintenance:   Last pap: Recent Labs[] Expand by Default        Lab Results  Component Value Date    DIAGPAP (A) 07/12/2020      - High grade squamous intraepithelial lesion (HSIL)    HPVHIGH Positive (A) 07/12/2020      S/p LEEP on 10/04/20 with Dr. Elgie Congo, CIN 2-3 pathology with negative margins, repeat pap 03/2021   Last mammogram:  N/a     Review of Systems:  Pertinent items noted in HPI and remainder of comprehensive ROS otherwise negative.  Physical Exam:   General:  Alert, oriented and cooperative. Patient appears to be in no acute distress.  Mental Status: Depressed mood and affect. Normal behavior. Normal judgment and thought content.    Respiratory: Normal respiratory effort, no problems with respiration noted  Rest of physical exam deferred due to type of encounter  Labs and Imaging No results found for this or any previous visit (from the past 336 hour(s)). No results found.     Assessment and Plan:     1. History of substance use disorder Stable on 8-12 mg daily Will come to clinic later this week to leave UDS Refill sent x3 weeks - ToxASSURE Select 13 (MW), Urine; Future - SUBOXONE 8-2 MG FILM; Take one and a half films daily  Dispense: 32 each; Refill: 0  2. Depression with anxiety Increased zoloft to 150mg  daily Discussed if symptoms not totally improved by next visit in 4 weeks will add on wellbutrin If that is not effective will recommend referral to psych/BH for consideration of mood stabilizing agents Following regularly with Jamie  3. History of depression  - sertraline (ZOLOFT) 100 MG tablet; Take 1.5 tablets (150 mg total) by mouth daily.  Dispense: 45 tablet; Refill: 5       I discussed the assessment and treatment plan with the patient. The patient was provided an opportunity to ask questions and all were answered. The patient agreed with the plan and demonstrated an understanding of the instructions.   The patient was advised to call back or seek an in-person evaluation/go to the ED if the symptoms worsen or if the condition fails to improve as anticipated.  I provided 15 minutes of face-to-face time during this encounter.   Clarnce Flock, MD Center for [place], [place]

## 2021-03-02 ENCOUNTER — Ambulatory Visit (INDEPENDENT_AMBULATORY_CARE_PROVIDER_SITE_OTHER): Payer: Medicaid Other | Admitting: Clinical

## 2021-03-02 DIAGNOSIS — F4321 Adjustment disorder with depressed mood: Secondary | ICD-10-CM

## 2021-03-02 DIAGNOSIS — F4310 Post-traumatic stress disorder, unspecified: Secondary | ICD-10-CM

## 2021-03-02 DIAGNOSIS — F3162 Bipolar disorder, current episode mixed, moderate: Secondary | ICD-10-CM

## 2021-03-02 NOTE — Patient Instructions (Signed)
Center for [facility] at [facility] for Women [address] [place], [place] [address] [phone] (main office) [phone] office)  [email or web address] [email or web address]  Mobile Crisis Hotline: [phone]

## 2021-03-05 NOTE — BH Specialist Note (Signed)
Pt did not arrive to video visit and did not answer the phone; Left HIPPA-compliant message to call back Dena Esperanza from [facility] at  [phone] (Kelsie Zaborowski's office).  ?; left MyChart message for patient.  ? ?

## 2021-03-07 DIAGNOSIS — Z419 Encounter for procedure for purposes other than remedying health state, unspecified: Secondary | ICD-10-CM | POA: Diagnosis not present

## 2021-03-16 ENCOUNTER — Ambulatory Visit: Payer: Medicaid Other | Admitting: Family Medicine

## 2021-03-16 ENCOUNTER — Telehealth: Payer: Self-pay | Admitting: Family Medicine

## 2021-03-16 NOTE — Telephone Encounter (Signed)
Called patient to let her know of appointment, there was no answer to the phone call so a voicemail was left with the call back number for the office.

## 2021-03-19 ENCOUNTER — Ambulatory Visit: Payer: Medicaid Other | Admitting: Clinical

## 2021-03-19 DIAGNOSIS — Z91199 Patient's noncompliance with other medical treatment and regimen due to unspecified reason: Secondary | ICD-10-CM

## 2021-03-21 ENCOUNTER — Encounter: Payer: Self-pay | Admitting: Family Medicine

## 2021-03-21 ENCOUNTER — Telehealth (INDEPENDENT_AMBULATORY_CARE_PROVIDER_SITE_OTHER): Payer: Medicaid Other | Admitting: Family Medicine

## 2021-03-21 ENCOUNTER — Ambulatory Visit: Payer: Medicaid Other | Admitting: Clinical

## 2021-03-21 DIAGNOSIS — Z87898 Personal history of other specified conditions: Secondary | ICD-10-CM | POA: Diagnosis not present

## 2021-03-21 DIAGNOSIS — R87613 High grade squamous intraepithelial lesion on cytologic smear of cervix (HGSIL): Secondary | ICD-10-CM | POA: Diagnosis not present

## 2021-03-21 DIAGNOSIS — F3162 Bipolar disorder, current episode mixed, moderate: Secondary | ICD-10-CM

## 2021-03-21 DIAGNOSIS — F4310 Post-traumatic stress disorder, unspecified: Secondary | ICD-10-CM

## 2021-03-21 DIAGNOSIS — F418 Other specified anxiety disorders: Secondary | ICD-10-CM

## 2021-03-21 DIAGNOSIS — F4321 Adjustment disorder with depressed mood: Secondary | ICD-10-CM

## 2021-03-21 MED ORDER — SUBOXONE 8-2 MG SL FILM: ORAL_FILM | SUBLINGUAL | 0 refills | Status: DC

## 2021-03-21 MED ORDER — BUPROPION HCL ER (XL) 150 MG PO TB24: 150.0000 mg | ORAL_TABLET | Freq: Every day | ORAL | 3 refills | Status: AC

## 2021-03-21 NOTE — Assessment & Plan Note (Signed)
Discussed with patient that given transportation has been so difficult for her we will need to look for a provider closer to home for her since she is so reliant on other people to drive her to appointments. Unfortunately I can not continue to prescribe her suboxone without appropriate monitoring of the treatment. Emphasized she will not be cut off, I will continue to prescribe until we find a better solution that is closer to home. In mean time while we are looking I asked her to make every effort possible to come in person to her next visit in one months time. Refill sent for suboxone, UDS next time she is in clinic.  ?

## 2021-03-21 NOTE — Progress Notes (Signed)
Pt does not have BP cuff at home.  

## 2021-03-21 NOTE — Assessment & Plan Note (Signed)
No improvement since our last visit. Saw Wanda Anderson, still ambivalent about getting local MH resources. We had previously discussed adding on Wellbutrin at her last visit, rx sent for 150mg  XL. Follow up at next visit.  ?

## 2021-03-21 NOTE — BH Specialist Note (Signed)
Integrated Behavioral Health via Telemedicine Visit ? ?03/21/2021 ?Wanda Anderson ?[id number] ? ?Number of Integrated Behavioral Health Clinician visits: Additional Visit ? ?Session Start time: 1100 ?  ?Session End time: 1110 ? ?Total time in minutes: 10 ? ? ?Referring Provider: Merian Capron, MD ?Patient/Family location: [place] ?[facility] Provider location: Center for [employer] at [facility] for Women ? ?All persons participating in visit: Patient Wanda Anderson and [facility] Wanda Anderson  ? ?Types of Service: Video visit and Other (Comment) very brief joint visit after missed visit earlier in week; no changes since last visit; schedule upcoming appointment for two weeks  ? ?I connected with Wanda Anderson and/or Wanda Anderson's  n/a  via  Telephone or Video Enabled Telemedicine Application  (Video is Caregility application) and verified that I am speaking with the correct person using two identifiers. Discussed confidentiality: Yes  ? ?I discussed the limitations of telemedicine and the availability of in person appointments.  Discussed there is a possibility of technology failure and discussed alternative modes of communication if that failure occurs. ? ?I discussed that engaging in this telemedicine visit, they consent to the provision of behavioral healthcare and the services will be billed under their insurance. ? ?Patient and/or legal guardian expressed understanding and consented to Telemedicine visit: Yes  ? ?Presenting Concerns: ?Patient and/or family reports the following symptoms/concerns: Recovering from covid in household and uncertainty over transferring care to [facility] until permanent housing found in that county, still staying with dad and taking medications as prescribed; pt says she does not feel any different on Zoloft than not taking.  ?Duration of problem: Ongoing; Severity of problem: moderate ? ?Patient and/or Family's Strengths/Protective Factors: ?Social connections,  Concrete supports in place (healthy food, safe environments, etc.), Sense of purpose, and Physical Health (exercise, healthy diet, medication compliance, etc.) ? ?Goals Addressed: ?Patient will: ? Reduce symptoms of: anxiety, depression, mood instability, and stress  ? ?Progress towards Goals: ?Ongoing ? ?Interventions: ?Interventions utilized:  Supportive Reflection ?Standardized Assessments completed: Not Needed ? ?Patient and/or Family Response: Patient agrees with treatment plan. ? ? ?Assessment: ?Patient currently experiencing PTSD, Bipolar affective disorder, Grief.  ? ?Patient may benefit from continued brief therapeutic interventions until established with psychiatry and ongoing therapy. ? ?Plan: ?Follow up with behavioral health clinician on : Two weeks ?Behavioral recommendations:  ?-Continue taking BH medications as prescribed (Suboxone and Zoloft) ?-Continue with plan to take second DMV tests (next step towards goal of obtaining driver's license) ?-Continue search for appropriate independent housing for family ?Referral(s): Integrated [employer] (In Clinic) ? ?I discussed the assessment and treatment plan with the patient and/or parent/guardian. They were provided an opportunity to ask questions and all were answered. They agreed with the plan and demonstrated an understanding of the instructions. ?  ?They were advised to call back or seek an in-person evaluation if the symptoms worsen or if the condition fails to improve as anticipated. ? ?Wanda Lips, LCSW ? ?Depression screen [facility] 2/9 03/02/2021 01/29/2021 01/09/2021 10/20/2020 10/18/2020  ?Decreased Interest 1 1 1  0 0  ?Down, Depressed, Hopeless 2 3 1  0 0  ?PHQ - 2 Score 3 4 2  0 0  ?Altered sleeping 3 3 1  0 0  ?Tired, decreased energy 3 3 1  0 0  ?Change in appetite 3 1 1  0 0  ?Feeling bad or failure about yourself  3 3 1  0 0  ?Trouble concentrating 0 0 1 0 0  ?Moving slowly or fidgety/restless 1 1  1 0 0  ?Suicidal thoughts 0 0 0 0 0   ?PHQ-9 Score 16 15 8  0 0  ? ?GAD 7 : Generalized Anxiety Score 03/02/2021 01/29/2021 01/09/2021 10/20/2020  ?Nervous, Anxious, on Edge 3 3 3  0  ?Control/stop worrying 3 3 3  0  ?Worry too much - different things 3 3 2  0  ?Trouble relaxing 1 3 3  0  ?Restless 0 1 1 0  ?Easily annoyed or irritable 3 3 2  0  ?Afraid - awful might happen 3 3 3  0  ?Total GAD 7 Score 16 19 17  0  ? ? ? ?

## 2021-03-21 NOTE — Assessment & Plan Note (Signed)
Due for pap, discussed with her we should collect at her next visit.  ?

## 2021-03-21 NOTE — Progress Notes (Signed)
? ? ?GYNECOLOGY VIRTUAL VISIT ENCOUNTER NOTE ? ?Provider location: [facility] [employer] [facility] [employer] [facility]  ? ?Patient location: Home ? ?I connected with Wanda Anderson on 03/21/21 at 10:35 AM EDT by MyChart Video Encounter and verified that I am speaking with the correct person using two identifiers. ?  ?I discussed the limitations, risks, security and privacy concerns of performing an evaluation and management service virtually and the availability of in person appointments. I also discussed with the patient that there may be a patient responsible charge related to this service. The patient expressed understanding and agreed to proceed. ?  ?History:  ?Wanda Anderson is a 31 y.o. [phone] female being evaluated today for suboxone follow up.  ? ?Last seen 02/20/2021, switched from in person to virtual despite me telling her I needed to see her in person ?Also asked her to drop off UDS after last visit, she did not ?Increased zoloft to 150mg , discussed starting wellbutrin today if not better ? ?Reports she tested positive for COVID a few days ago as reason for not coming in ?Reports mood is not any different from last visit ? ? ?  ?  ?Past Medical History:  ?Diagnosis Date  ? Alcohol abuse   ? 10-02-2020 per pt can drink one bottle of wine daily  but mostly 2 bottles wine per week  ? Bipolar 1 disorder (HCC)   ? Family history of adverse reaction to anesthesia   ? per pt her father has anesthesia awareness, has to be given very high doses  ? HGSIL on Pap smear of cervix   ? 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HCC)   ? followed by dr 10-04-2020 (hematology)  ? History of depression   ? History of drug abuse in remission [facility])   ? per pt in remission since 12/ 2016  ? Hx of arteriovenous malformation (AVM)   ? 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 IDA (iron deficiency anemia)   ? Idiopathic scoliosis of thoracolumbar spine   ? s/p  spinal arthrodesis T5 to L4 on  07/ 2005  ? PTSD (post-traumatic stress disorder)   ? Recurrent epistaxis   ? due to HHT, pt states very easily bleed's  ? ?Past Surgical History:  ?Procedure Laterality Date  ? LAPAROSCOPIC BILATERAL SALPINGECTOMY Bilateral 10/04/2020  ? Procedure: LAPAROSCOPIC BILATERAL SALPINGECTOMY;  Surgeon: 10/06/2020, MD;  Location: [facility];  Service: Gynecology;  Laterality: Bilateral;  ? LEEP N/A 10/04/2020  ? Procedure: LOOP ELECTROSURGICAL EXCISION PROCEDURE (LEEP) WITH COLPO;  Surgeon: 10/06/2020, MD;  Location: [facility];  Service: Gynecology;  Laterality: N/A;  ? OTHER SURGICAL HISTORY    ? Cardiac stent as child, pt unsure of exact procedure  ? POSTERIOR FUSION SPINAL DEFORMITY  07/20/2003  ? @[facility]  by dr 07/22/2003;   T5--9 lamenectomy's and arthrodesis T5  to L4  ? TONSILLECTOMY    ? child  ? ?The following portions of the patient's history were reviewed and updated as appropriate: allergies, current medications, past family history, past medical history, past social history, past surgical history and problem list.  ? ?Health Maintenance:   ?  ?Lab Results  ?Component Value Date  ?  DIAGPAP (A) 07/12/2020  ?    -  High grade squamous intraepithelial lesion (HSIL)  ?  HPVHIGH Positive (A) 07/12/2020  ?  ?  ?S/p LEEP on 10/04/20 with Dr. Donavan Foil, CIN 2-3 pathology with negative margins, repeat pap 03/2021 ? ? ?Review of Systems:  ?Pertinent items noted in HPI and remainder of comprehensive ROS otherwise negative. ? ?Physical Exam:  ? ?General:  Alert, oriented and cooperative. Patient appears to be in no acute distress.  ?Mental Status: Normal mood and affect. Normal behavior. Normal judgment and thought content.   ?Respiratory: Normal respiratory effort, no problems with respiration noted   ?Rest of physical exam deferred due to type of encounter ? ?Labs and Imaging ?No results found for this or any previous visit (from the past 336 hour(s)). ?No results found.   ?  ?Assessment and Plan:  ?   ?History of substance use disorder ?Discussed with patient that given transportation has been so difficult for her we will need to look for a provider closer to home for her since she is so reliant on other people to drive her to appointments. Unfortunately I can not continue to prescribe her suboxone without appropriate monitoring of the treatment. Emphasized she will not be cut off, I will continue to prescribe until we find a better solution that is closer to home. In mean time while we are looking I asked her to make every effort possible to come in person to her next visit in one months time. Refill sent for suboxone, UDS next time she is in clinic.  ? ?Depression with anxiety ?No improvement since our last visit. Saw [name], still ambivalent about getting local MH resources. We had previously discussed adding on Wellbutrin at her last visit, rx sent for 150mg  XL. Follow up at next visit.  ? ?High grade squamous intraepithelial cervical dysplasia ?Due for pap, discussed with her we should collect at her next visit.  ? ?   ?  ?I discussed the assessment and treatment plan with the patient. The patient was provided an opportunity to ask questions and all were answered. The patient agreed with the plan and demonstrated an understanding of the instructions. ?  ?The patient was advised to call back or seek an in-person evaluation/go to the ED if the symptoms worsen or if the condition fails to improve as anticipated. ? ?I provided 12 minutes of face-to-face time during this encounter. ? ? ? , MD ?[facility], [facility] ? ?

## 2021-03-22 NOTE — BH Specialist Note (Signed)
Integrated Behavioral Health via Telemedicine Visit ? ?04/04/2021 ?Wanda Mince Fornes ?[id number] ? ?Number of Integrated Behavioral Health Clinician visits: Additional Visit ? ?Session Start time: 1155 ?  ?Session End time: 1224 ? ?Total time in minutes: 29 ? ? ?Referring Provider: Merian Capron, MD ?Patient/Family location: Home ?[facility] Provider location: [facility] [employer] [facility] ? ?All persons participating in visit: Patient Wanda Anderson and [facility]rawder[facility]  ? ?Types of Service: Individual psychotherapy and Telephone visit ? ?I connected with Wanda Anderson and/or Wanda Anderson's  n/a  via  Telephone or Video Enabled Telemedicine Application  (Video is Caregility application) and verified that I am speaking with the correct person using two identifiers. Discussed confidentiality: Yes  ? ?I discussed the limitations of telemedicine and the availability of in person appointments.  Discussed there is a possibility of technology failure and discussed alternative modes of communication if that failure occurs. ? ?I discussed that engaging in this telemedicine visit, they consent to the provision of behavioral healthcare and the services will be billed under their insurance. ? ?Patient and/or legal guardian expressed understanding and consented to Telemedicine visit: Yes  ? ?Presenting Concerns: ?Patient and/or family reports the following symptoms/concerns: Sister's recent cancer diagnosis, failing DMV test; ongoing housing and job search; taking Wellbutrin with no side effects noticed.  ?Duration of problem: Ongoing; Severity of problem: moderate ? ?Patient and/or Family's Strengths/Protective Factors: ?Social connections, Concrete supports in place (healthy food, safe environments, etc.), Sense of purpose, and Physical Health (exercise, healthy diet, medication compliance, etc.) ? ?Goals Addressed: ?Patient will: ? Reduce symptoms of: anxiety, depression, mood instability,  and stress  ? Demonstrate ability to: Increase healthy adjustment to current life circumstances ? ?Progress towards Goals: ?Ongoing ? ?Interventions: ?Interventions utilized:  Medication Monitoring and Supportive Reflection ?Standardized Assessments completed: Not Needed ? ?Patient and/or Family Response: Patient agrees with treatment plan ? ?Assessment: ?Patient currently experiencing PTSD, Bipolar affective disorder, Grief.  ? ?Patient may benefit from continued brief therapeutic interventions. ? ?Plan: ?Follow up with behavioral health clinician on : Three weeks ?Behavioral recommendations:  ?-Continue taking Wellbutrin and Suboxone as prescribed ?-Continue renewed relationship with sister; continue spending time with supportive friends/family members ?-Continue with plan to take DMV test again on 04/09/20 ?-Call [facility] medical practices as directed by Dr Crissie Reese to establish care for ongoing [facility] medication management  ?Referral(s): Integrated [employer] (In Clinic) ? ?I discussed the assessment and treatment plan with the patient and/or parent/guardian. They were provided an opportunity to ask questions and all were answered. They agreed with the plan and demonstrated an understanding of the instructions. ?  ?They were advised to call back or seek an in-person evaluation if the symptoms worsen or if the condition fails to improve as anticipated. ? ?[name], LCSW ? ? ?  03/02/2021  ? 10:59 AM 01/29/2021  ? 10:45 AM 01/09/2021  ?  2:02 PM 10/20/2020  ? 10:47 AM 10/18/2020  ? 11:49 AM  ?Depression screen PHQ 2/9  ?Decreased Interest 1 1 1  0 0  ?Down, Depressed, Hopeless 2 3 1  0 0  ?PHQ - 2 Score 3 4 2  0 0  ?Altered sleeping 3 3 1  0 0  ?Tired, decreased energy 3 3 1  0 0  ?Change in appetite 3 1 1  0 0  ?Feeling bad or failure about yourself  3 3 1  0 0  ?Trouble concentrating 0 0 1 0 0  ?Moving slowly or fidgety/restless 1 1  1 0 0  ?Suicidal thoughts 0 0 0 0 0  ?PHQ-9 Score 16 15 8  0 0  ? ? ?   03/02/2021  ? 11:01 AM 01/29/2021  ? 10:46 AM 01/09/2021  ?  2:02 PM 10/20/2020  ? 10:47 AM  ?GAD 7 : Generalized Anxiety Score  ?Nervous, Anxious, on Edge 3 3 3  0  ?Control/stop worrying 3 3 3  0  ?Worry too much - different things 3 3 2  0  ?Trouble relaxing 1 3 3  0  ?Restless 0 1 1 0  ?Easily annoyed or irritable 3 3 2  0  ?Afraid - awful might happen 3 3 3  0  ?Total GAD 7 Score 16 19 17  0  ? ? ? ? ?

## 2021-04-04 ENCOUNTER — Ambulatory Visit (INDEPENDENT_AMBULATORY_CARE_PROVIDER_SITE_OTHER): Payer: Medicaid Other | Admitting: Clinical

## 2021-04-04 DIAGNOSIS — F4310 Post-traumatic stress disorder, unspecified: Secondary | ICD-10-CM | POA: Diagnosis not present

## 2021-04-04 DIAGNOSIS — F3162 Bipolar disorder, current episode mixed, moderate: Secondary | ICD-10-CM

## 2021-04-07 DIAGNOSIS — Z419 Encounter for procedure for purposes other than remedying health state, unspecified: Secondary | ICD-10-CM | POA: Diagnosis not present

## 2021-04-11 NOTE — BH Specialist Note (Signed)
Integrated Behavioral Health via Telemedicine Visit ? ?04/25/2021 ?Wanda Anderson ?[id number] ? ?Number of Integrated Behavioral Health Clinician visits: Additional Visit ? ?Session Start time: 1514 ?  ?Session End time: 1224 ? ?Total time in minutes: 29 ? ? ?Referring Provider: Merian Capron, MD ?Patient/Family location: Home ?[facility] Provider location: [facility] [employer] [facility] ? ?All persons participating in visit: Patient Wanda Anderson and [facility] Wanda Anderson  ? ?Types of Service: Individual psychotherapy and Video visit ? ?I connected with Wanda Anderson and/or Wanda Anderson's  n/a  via  Telephone or Video Enabled Telemedicine Application  (Video is Caregility application) and verified that I am speaking with the correct person using two identifiers. Discussed confidentiality: Yes  ? ?I discussed the limitations of telemedicine and the availability of in person appointments.  Discussed there is a possibility of technology failure and discussed alternative modes of communication if that failure occurs. ? ?I discussed that engaging in this telemedicine visit, they consent to the provision of behavioral healthcare and the services will be billed under their insurance. ? ?Patient and/or legal guardian expressed understanding and consented to Telemedicine visit: Yes  ? ?Presenting Concerns: ?Patient and/or family reports the following symptoms/concerns: Ongoing life stress (housing; job [employer]; health issues, financial) ?Duration of problem: Ongoing; Severity of problem: moderate ? ?Patient and/or Family's Strengths/Protective Factors: ?Social connections, Concrete supports in place (healthy food, safe environments, etc.), Sense of purpose, and Physical Health (exercise, healthy diet, medication compliance, etc.) ? ?Goals Addressed: ?Patient will: ? Reduce symptoms of: anxiety, depression, mood instability, and stress  ? Demonstrate ability to: Increase healthy adjustment to  current life circumstances and Increase motivation to adhere to plan of care ? ?Progress towards Goals: ?Ongoing ? ?Interventions: ?Interventions utilized:  Motivational Interviewing and Supportive Reflection ?Standardized Assessments completed: Not Needed ? ?Patient and/or Family Response: Patient agrees with treatment plan. ? ? ?Assessment: ?Patient currently experiencing PTSD, Bipolar affective disorder, Grief and Psychosocial stress.  ? ?Patient may benefit from continued brief therapeutic interventions. ? ?Plan: ?Follow up with behavioral health clinician on : Two weeks ?Behavioral recommendations:  ?-Continue taking Wellbutrin and Suboxone as prescribed ?-Continue with plan to establish care with [facility] medical practices to establish care for ongoing [facility] medication management ?-Continue plan to contact financial aid at school of choice as first step towards goal of going back to school ?-Continue home and job search; using self-coping strategies to manage stress ?Referral(s): Integrated [employer] (In Clinic) ? ?I discussed the assessment and treatment plan with the patient and/or parent/guardian. They were provided an opportunity to ask questions and all were answered. They agreed with the plan and demonstrated an understanding of the instructions. ?  ?They were advised to call back or seek an in-person evaluation if the symptoms worsen or if the condition fails to improve as anticipated. ? ?Wanda Lips, LCSW ? ? ?  03/02/2021  ? 10:59 AM 01/29/2021  ? 10:45 AM 01/09/2021  ?  2:02 PM 10/20/2020  ? 10:47 AM 10/18/2020  ? 11:49 AM  ?Depression screen PHQ 2/9  ?Decreased Interest 1 1 1  0 0  ?Down, Depressed, Hopeless 2 3 1  0 0  ?PHQ - 2 Score 3 4 2  0 0  ?Altered sleeping 3 3 1  0 0  ?Tired, decreased energy 3 3 1  0 0  ?Change in appetite 3 1 1  0 0  ?Feeling bad or failure about yourself  3 3 1  0 0  ?Trouble concentrating 0  0 1 0 0  ?Moving slowly or fidgety/restless 1 1 1  0 0  ?Suicidal  thoughts 0 0 0 0 0  ?PHQ-9 Score 16 15 8  0 0  ? ? ?  03/02/2021  ? 11:01 AM 01/29/2021  ? 10:46 AM 01/09/2021  ?  2:02 PM 10/20/2020  ? 10:47 AM  ?GAD 7 : Generalized Anxiety Score  ?Nervous, Anxious, on Edge 3 3 3  0  ?Control/stop worrying 3 3 3  0  ?Worry too much - different things 3 3 2  0  ?Trouble relaxing 1 3 3  0  ?Restless 0 1 1 0  ?Easily annoyed or irritable 3 3 2  0  ?Afraid - awful might happen 3 3 3  0  ?Total GAD 7 Score 16 19 17  0  ? ? ? ?

## 2021-04-20 ENCOUNTER — Encounter: Payer: Self-pay | Admitting: Family Medicine

## 2021-04-20 ENCOUNTER — Ambulatory Visit (INDEPENDENT_AMBULATORY_CARE_PROVIDER_SITE_OTHER): Payer: Medicaid Other | Admitting: Family Medicine

## 2021-04-20 ENCOUNTER — Ambulatory Visit: Payer: Medicaid Other | Admitting: Family Medicine

## 2021-04-20 ENCOUNTER — Other Ambulatory Visit (HOSPITAL_COMMUNITY)
Admission: RE | Admit: 2021-04-20 | Discharge: 2021-04-20 | Disposition: A | Discharge status: Home / Self Care | Payer: Medicaid Other | Source: Ambulatory Visit | Attending: Family Medicine | Admitting: Family Medicine

## 2021-04-20 VITALS — BP 105/72 | HR 87 | Wt 132.0 lb

## 2021-04-20 DIAGNOSIS — R87613 High grade squamous intraepithelial lesion on cytologic smear of cervix (HGSIL): Secondary | ICD-10-CM

## 2021-04-20 DIAGNOSIS — K5903 Drug induced constipation: Secondary | ICD-10-CM | POA: Diagnosis not present

## 2021-04-20 DIAGNOSIS — Z87898 Personal history of other specified conditions: Secondary | ICD-10-CM | POA: Diagnosis not present

## 2021-04-20 DIAGNOSIS — F418 Other specified anxiety disorders: Secondary | ICD-10-CM

## 2021-04-20 MED ORDER — SUBOXONE 8-2 MG SL FILM: ORAL_FILM | SUBLINGUAL | 0 refills | Status: DC

## 2021-04-20 MED ORDER — POLYETHYLENE GLYCOL 3350 17 GM/SCOOP PO POWD: 17.0000 g | Freq: Every day | ORAL | 1 refills | Status: AC | PRN

## 2021-04-20 NOTE — Addendum Note (Signed)
Addended by: Brita Romp E on: 04/20/2021 12:20 PM ? ? Modules accepted: Orders ? ?

## 2021-04-20 NOTE — Assessment & Plan Note (Addendum)
Added on wellbutrin to zoloft at last visit, today reports mood is unchanged. Instructed to establish care with psychiatrist as I am at limit of my abilities to treat her mood.  ?

## 2021-04-20 NOTE — Patient Instructions (Signed)
[facility] ?[address] ?[place], [place] [address] ?Phone: [phone] ?Fax: [phone] ?Customer Service: [email or web address] ?Services provided: ?Primary Care ?Urgent Care ?Weight Loss Clinic ?Gastroenterology ?Pain Management ?Dermatology ?Psychiatry ?Book Your Appointment Online! ?For our [place] location, book your appointment using the form below. ? ?Select new or returning patient. ?Select the reason for your visit (visit type). ?Select a provider. ?Select an available appointment time. ?Enter your information and submit! ?If you need to schedule a visit with a specialist provider, please request an appointment or call [phone]. ? ? ? ?Location Hours: ?Monday 8:00 AM - 6:00 PM ?Tuesday 8:00 AM - 6:00 PM ?Wednesday 8:00 AM - 6:00 PM ?Thursday 8:00 AM - 6:00 PM ?Friday 8:00 AM - 6:00 PM ?Saturday 8:00 AM - 6:00 PM ?Sunday Closed ? ?

## 2021-04-20 NOTE — Assessment & Plan Note (Addendum)
Doing well.  ?She still has not found another suboxone prescriber closer to home. ?Will send two week supply today, can send longer supply once UDS returns as long as appropriate.  ?

## 2021-04-20 NOTE — Progress Notes (Signed)
? ?GYNECOLOGY OFFICE VISIT NOTE ? ?History:  ? Wanda Anderson is a 31 y.o. [phone] here today for follow up of OUD and mood check. ? ?Last seen 03/21/2021 ?On zoloft, added on wellbutrin ?Also instructed to start looking for new OUD provider closer to home as she has significant transportation barriers ? ?Still taking 1.5 films of 8 mg per day ?Has not yet found a provider closer to home ? ?Mood is about the same ? ?Health Maintenance Due  ?Topic Date Due  ? COVID-19 Vaccine (1) Never done  ? Hepatitis C Screening  Never done  ? TETANUS/TDAP  Never done  ? ? ?Past Medical History:  ?Diagnosis Date  ? Alcohol abuse   ? 10-02-2020 per pt can drink one bottle of wine daily  but mostly 2 bottles wine per week  ? Bipolar 1 disorder (HCC)   ? Family history of adverse reaction to anesthesia   ? per pt her father has anesthesia awareness, has to be given very high doses  ? HGSIL on Pap smear of cervix   ? 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HCC)   ? followed by dr Myna Hidalgo (hematology)  ? History of depression   ? History of drug abuse in remission [facility])   ? per pt in remission since 12/ 2016  ? Hx of arteriovenous malformation (AVM)   ? 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 IDA (iron deficiency anemia)   ? Idiopathic scoliosis of thoracolumbar spine   ? s/p  spinal arthrodesis T5 to L4 on  07/ 2005  ? PTSD (post-traumatic stress disorder)   ? Recurrent epistaxis   ? due to HHT, pt states very easily bleed's  ? ? ?Past Surgical History:  ?Procedure Laterality Date  ? LAPAROSCOPIC BILATERAL SALPINGECTOMY Bilateral 10/04/2020  ? Procedure: LAPAROSCOPIC BILATERAL SALPINGECTOMY;  Surgeon: Warden Fillers, MD;  [facility];  Service: Gynecology;  Laterality:  Bilateral;  ? LEEP N/A 10/04/2020  ? Procedure: LOOP ELECTROSURGICAL EXCISION PROCEDURE (LEEP) WITH COLPO;  Surgeon: Warden Fillers, MD;  Location: [facility];  Service: Gynecology;  Laterality: N/A;  ? OTHER SURGICAL HISTORY    ? Cardiac stent as child, pt unsure of exact procedure  ? POSTERIOR FUSION SPINAL DEFORMITY  07/20/2003  ? @[facility]  by dr ;   T5--9 lamenectomy's and arthrodesis T5  to L4  ? TONSILLECTOMY    ? child  ? ? ?The following portions of the patient's history were reviewed and updated as appropriate: allergies, current medications, past family history, past medical history, past social history, past surgical history and problem list.  ? ?Health Maintenance:   ?Last pap: ?Lab Results  ?Component Value Date  ? DIAGPAP (A) 07/12/2020  ?  - High grade squamous intraepithelial lesion (HSIL)  ? HPVHIGH Positive (A) 07/12/2020  ? ?S/p LEEP on 10/04/20 with Dr. 10/06/20, CIN 2-3 pathology with negative margins, repeat pap 03/2021 ? ?Last mammogram:  ?N/a  ? ? ?Review of Systems:  ?Pertinent items noted in HPI and remainder of comprehensive ROS otherwise negative. ? ?Physical Exam:  ?BP 105/72   Pulse 87   Wt 132 lb (59.9 kg)   BMI 21.63 kg/m?  ?CONSTITUTIONAL: Well-developed, well-nourished female in no acute distress.  ?HEENT:  Normocephalic, atraumatic. External right and left ear normal. No scleral icterus.  ?NECK: Normal range of motion, supple, no masses noted on observation ?SKIN: No rash noted. Not diaphoretic. No erythema. No pallor. ?MUSCULOSKELETAL: Normal range of motion. No edema noted. ?NEUROLOGIC: Alert and oriented to person, place, and time. Normal muscle tone coordination.  ?PSYCHIATRIC: Normal mood and affect. Normal behavior. Normal judgment and thought content. ?RESPIRATORY: Effort normal, no problems with respiration noted ? ?Labs and Imaging ?No results found for this or any previous visit (from the past 168 hour(s)). ?No results found.    ?Assessment and Plan:   ? ?Problem List Items Addressed This Visit   ? ?  ? Other  ? High grade squamous intraepithelial cervical dysplasia  ?  Repeat post LEEP pap collected today ?  ?  ? Relevant Orders  ? Cytology - PAP( [place])  ? Depression with anxiety  ?  Added on wellbutrin to zoloft at last visit, today reports mood is unchanged. Instructed to establish care with psychiatrist as I am at limit of my abilities to treat her mood.  ?  ?  ? Relevant Orders  ? ToxASSURE Select 13 (MW), Urine  ? History of substance use disorder - Primary  ?  Doing well.  ?She still has not found another suboxone prescriber closer to home. ?Will send two week supply today, can send longer supply once UDS returns as long as appropriate.  ?  ?  ? Relevant Medications  ? SUBOXONE 8-2 MG FILM  ? ? ?Routine preventative health maintenance measures emphasized. ?Please refer to After Visit Summary for other counseling recommendations.  ? ?Return in about 2 months (around 06/20/2021) for in person, OUD/mood check in.   ? ?Total face-to-face time with patient: 20 minutes.  Over 50% of encounter was spent on counseling and coordination of care. ? ? ?Venora Maples, MD/MPH ?Attending Family Medicine Physician, Faculty Practice ?[facility] [employer], [facility] ? ?

## 2021-04-20 NOTE — Assessment & Plan Note (Signed)
Repeat post LEEP pap collected today ?

## 2021-04-23 LAB — CERVICOVAGINAL ANCILLARY ONLY
Bacterial Vaginitis (gardnerella): POSITIVE — AB
Candida Glabrata: NEGATIVE
Candida Vaginitis: NEGATIVE
Comment: NEGATIVE
Comment: NEGATIVE
Comment: NEGATIVE

## 2021-04-23 MED ORDER — METRONIDAZOLE 0.75 % VA GEL: 1.0000 | Freq: Every day | VAGINAL | 0 refills | Status: AC

## 2021-04-23 NOTE — Addendum Note (Signed)
Addended by: Merian Capron on: 04/23/2021 02:29 PM ? ? Modules accepted: Orders ? ?

## 2021-04-24 LAB — CYTOLOGY - PAP
Comment: NEGATIVE
Diagnosis: UNDETERMINED — AB
High risk HPV: NEGATIVE

## 2021-04-25 ENCOUNTER — Ambulatory Visit (INDEPENDENT_AMBULATORY_CARE_PROVIDER_SITE_OTHER): Payer: Medicaid Other | Admitting: Clinical

## 2021-04-25 DIAGNOSIS — F4321 Adjustment disorder with depressed mood: Secondary | ICD-10-CM

## 2021-04-25 DIAGNOSIS — F4310 Post-traumatic stress disorder, unspecified: Secondary | ICD-10-CM

## 2021-04-25 DIAGNOSIS — F3162 Bipolar disorder, current episode mixed, moderate: Secondary | ICD-10-CM

## 2021-04-25 DIAGNOSIS — Z658 Other specified problems related to psychosocial circumstances: Secondary | ICD-10-CM

## 2021-04-26 LAB — TOXASSURE SELECT 13 (MW), URINE

## 2021-04-26 NOTE — BH Specialist Note (Signed)
Pt did not arrive to video visit and did not answer the phone; Left HIPPA-compliant message to call back Debany Vantol from [facility] at  [phone] (Yazleen Molock's office).  ?; left MyChart message for patient.  ? ?

## 2021-05-07 DIAGNOSIS — Z419 Encounter for procedure for purposes other than remedying health state, unspecified: Secondary | ICD-10-CM | POA: Diagnosis not present

## 2021-05-09 ENCOUNTER — Ambulatory Visit: Payer: Medicaid Other | Admitting: Clinical

## 2021-05-09 DIAGNOSIS — Z91199 Patient's noncompliance with other medical treatment and regimen due to unspecified reason: Secondary | ICD-10-CM

## 2021-05-10 ENCOUNTER — Telehealth: Payer: Self-pay | Admitting: Clinical

## 2021-05-10 NOTE — Telephone Encounter (Signed)
Returned pt's call; scheduled for follow up visit; pt stung by bee today; highly recommended to go to Urgent Care or ED as needed (used to have epi pen for bee stings in the past; does not have epi pen for years now). Pt agrees to call friend after this call to let someone else know what is happening; agrees friend or neighbor can take to ED if needed.  ?

## 2021-05-13 ENCOUNTER — Other Ambulatory Visit: Payer: Self-pay | Admitting: Family Medicine

## 2021-05-13 DIAGNOSIS — Z87898 Personal history of other specified conditions: Secondary | ICD-10-CM

## 2021-05-14 ENCOUNTER — Telehealth: Payer: Self-pay

## 2021-05-14 DIAGNOSIS — Z87898 Personal history of other specified conditions: Secondary | ICD-10-CM

## 2021-05-14 NOTE — Telephone Encounter (Signed)
Call received from patient. Pt asking for refill of Suboxone. Pt states has been taking 1.5 to 2 films a day due to having more stress taking care of young family and feeling the need to take more so she wont relapse. Pt states has 10 films left and is feared of running out. Pt had last appt with Dr Crissie Reese on 04/20/21. Missed appt with [facility] on 05/09/21. ? ?Routing to Dr Crissie Reese. Please advise.  ? ?Tatum Massman,RN  ?

## 2021-05-15 MED ORDER — SUBOXONE 8-2 MG SL FILM: ORAL_FILM | SUBLINGUAL | 0 refills | Status: AC

## 2021-05-15 NOTE — Telephone Encounter (Signed)
UDS appropriate from last visit ?Scheduled for follow up with me on 06/20/2021 ?Will send 45 day supply ?

## 2021-05-16 NOTE — BH Specialist Note (Signed)
Integrated Behavioral Health via Telemedicine Visit ? ?05/16/2021 ?Wanda Anderson ?[id number] ? ?Pt requested Medicaid ID number prior to her dental appointment today at 2pm; ID number given.  ? ?Pt arrived to video visit after appointment time (appt time 2:45pm); requesting information about hematology letter giving approval for tubal around July 2022, as dentist is requesting approval from hematology prior to dental surgery. Pt informed that letter from hematology from July 2022 is specifically for that procedure, and that she would need to contact hematology for further recommendations.  ? ?Pt requests contact information for hematology be left on her voicemail; agrees to also have information sent via MyChart message.  ? ?As pt is driving, visit for today will be rescheduled at a later date. [facility] [name] will call [name] tomorrow, 05/22/2021; [name] may also call [name] at [phone] or front desk at [phone] to reschedule. ? ?Garlan Fair, LCSW ?

## 2021-05-21 ENCOUNTER — Encounter: Payer: Self-pay | Admitting: *Deleted

## 2021-05-21 ENCOUNTER — Ambulatory Visit: Payer: Medicaid Other | Admitting: Clinical

## 2021-05-21 DIAGNOSIS — Z658 Other specified problems related to psychosocial circumstances: Secondary | ICD-10-CM

## 2021-06-05 DIAGNOSIS — R112 Nausea with vomiting, unspecified: Secondary | ICD-10-CM | POA: Diagnosis not present

## 2021-06-05 DIAGNOSIS — R109 Unspecified abdominal pain: Secondary | ICD-10-CM | POA: Diagnosis not present

## 2021-06-05 DIAGNOSIS — F17200 Nicotine dependence, unspecified, uncomplicated: Secondary | ICD-10-CM | POA: Diagnosis not present

## 2021-06-05 DIAGNOSIS — Z79899 Other long term (current) drug therapy: Secondary | ICD-10-CM | POA: Diagnosis not present

## 2021-06-07 DIAGNOSIS — Z419 Encounter for procedure for purposes other than remedying health state, unspecified: Secondary | ICD-10-CM | POA: Diagnosis not present

## 2021-06-20 ENCOUNTER — Ambulatory Visit: Payer: Medicaid Other | Admitting: Family Medicine

## 2021-07-04 ENCOUNTER — Telehealth: Payer: Self-pay | Admitting: Clinical

## 2021-07-04 NOTE — Telephone Encounter (Signed)
Returned pt call; pt is currently in [place] with family and sorry she missed appointment to continue Suboxone as "nerves hurt" making the 1.5 hour drive to [place]. Pt agrees to referral to Suboxone prescriber closer to her current home in [place], [place]. Pt requests Dr Crissie Reese know that she needs to transfer to [facility].   [id number] referrals placed for [facility] and [facility], both less than 15 miles from her home; gave pt additional numbers to call, via MyChart, for as needed. Pt agrees to call [name] at [phone] with any other questions or concerns.

## 2021-07-07 DIAGNOSIS — Z419 Encounter for procedure for purposes other than remedying health state, unspecified: Secondary | ICD-10-CM | POA: Diagnosis not present

## 2021-08-07 DIAGNOSIS — Z419 Encounter for procedure for purposes other than remedying health state, unspecified: Secondary | ICD-10-CM | POA: Diagnosis not present

## 2021-08-14 DIAGNOSIS — F411 Generalized anxiety disorder: Secondary | ICD-10-CM | POA: Diagnosis not present

## 2021-08-14 DIAGNOSIS — F17210 Nicotine dependence, cigarettes, uncomplicated: Secondary | ICD-10-CM | POA: Diagnosis not present

## 2021-08-14 DIAGNOSIS — F250 Schizoaffective disorder, bipolar type: Secondary | ICD-10-CM | POA: Diagnosis not present

## 2021-08-14 DIAGNOSIS — F4310 Post-traumatic stress disorder, unspecified: Secondary | ICD-10-CM | POA: Diagnosis not present

## 2021-08-23 DIAGNOSIS — Z658 Other specified problems related to psychosocial circumstances: Secondary | ICD-10-CM | POA: Diagnosis not present

## 2021-08-23 DIAGNOSIS — F3113 Bipolar disorder, current episode manic without psychotic features, severe: Secondary | ICD-10-CM | POA: Diagnosis not present

## 2021-09-07 DIAGNOSIS — Z419 Encounter for procedure for purposes other than remedying health state, unspecified: Secondary | ICD-10-CM | POA: Diagnosis not present

## 2021-10-07 DIAGNOSIS — Z419 Encounter for procedure for purposes other than remedying health state, unspecified: Secondary | ICD-10-CM | POA: Diagnosis not present

## 2021-10-22 DIAGNOSIS — N930 Postcoital and contact bleeding: Secondary | ICD-10-CM | POA: Diagnosis not present

## 2021-10-22 DIAGNOSIS — Z113 Encounter for screening for infections with a predominantly sexual mode of transmission: Secondary | ICD-10-CM | POA: Diagnosis not present

## 2021-10-22 DIAGNOSIS — R87618 Other abnormal cytological findings on specimens from cervix uteri: Secondary | ICD-10-CM | POA: Diagnosis not present

## 2021-10-22 DIAGNOSIS — Z8742 Personal history of other diseases of the female genital tract: Secondary | ICD-10-CM | POA: Diagnosis not present

## 2021-10-30 DIAGNOSIS — F3111 Bipolar disorder, current episode manic without psychotic features, mild: Secondary | ICD-10-CM | POA: Diagnosis not present

## 2021-10-30 DIAGNOSIS — I780 Hereditary hemorrhagic telangiectasia: Secondary | ICD-10-CM | POA: Diagnosis not present

## 2021-10-30 DIAGNOSIS — F418 Other specified anxiety disorders: Secondary | ICD-10-CM | POA: Diagnosis not present

## 2021-10-30 DIAGNOSIS — J40 Bronchitis, not specified as acute or chronic: Secondary | ICD-10-CM | POA: Diagnosis not present

## 2021-10-30 DIAGNOSIS — D508 Other iron deficiency anemias: Secondary | ICD-10-CM | POA: Diagnosis not present

## 2021-10-30 DIAGNOSIS — Z9889 Other specified postprocedural states: Secondary | ICD-10-CM | POA: Diagnosis not present

## 2021-10-30 DIAGNOSIS — F1991 Other psychoactive substance use, unspecified, in remission: Secondary | ICD-10-CM | POA: Diagnosis not present

## 2021-11-22 DIAGNOSIS — F319 Bipolar disorder, unspecified: Secondary | ICD-10-CM | POA: Diagnosis not present

## 2021-11-26 DIAGNOSIS — J40 Bronchitis, not specified as acute or chronic: Secondary | ICD-10-CM | POA: Diagnosis not present

## 2021-12-01 DIAGNOSIS — D508 Other iron deficiency anemias: Secondary | ICD-10-CM | POA: Diagnosis not present

## 2021-12-04 DIAGNOSIS — F319 Bipolar disorder, unspecified: Secondary | ICD-10-CM | POA: Diagnosis not present

## 2021-12-07 DIAGNOSIS — Z419 Encounter for procedure for purposes other than remedying health state, unspecified: Secondary | ICD-10-CM | POA: Diagnosis not present

## 2021-12-18 DIAGNOSIS — J40 Bronchitis, not specified as acute or chronic: Secondary | ICD-10-CM | POA: Diagnosis not present

## 2022-01-07 DIAGNOSIS — Z419 Encounter for procedure for purposes other than remedying health state, unspecified: Secondary | ICD-10-CM | POA: Diagnosis not present

## 2022-02-07 DIAGNOSIS — Z419 Encounter for procedure for purposes other than remedying health state, unspecified: Secondary | ICD-10-CM | POA: Diagnosis not present

## 2022-03-08 DIAGNOSIS — Z419 Encounter for procedure for purposes other than remedying health state, unspecified: Secondary | ICD-10-CM | POA: Diagnosis not present

## 2022-04-08 DIAGNOSIS — Z419 Encounter for procedure for purposes other than remedying health state, unspecified: Secondary | ICD-10-CM | POA: Diagnosis not present

## 2022-05-08 DIAGNOSIS — Z419 Encounter for procedure for purposes other than remedying health state, unspecified: Secondary | ICD-10-CM | POA: Diagnosis not present

## 2022-06-08 DIAGNOSIS — Z419 Encounter for procedure for purposes other than remedying health state, unspecified: Secondary | ICD-10-CM | POA: Diagnosis not present

## 2022-06-28 ENCOUNTER — Ambulatory Visit: Payer: Medicaid Other | Admitting: Family Medicine

## 2022-07-08 DIAGNOSIS — Z419 Encounter for procedure for purposes other than remedying health state, unspecified: Secondary | ICD-10-CM | POA: Diagnosis not present

## 2022-08-08 DIAGNOSIS — Z419 Encounter for procedure for purposes other than remedying health state, unspecified: Secondary | ICD-10-CM | POA: Diagnosis not present

## 2022-09-08 DIAGNOSIS — Z419 Encounter for procedure for purposes other than remedying health state, unspecified: Secondary | ICD-10-CM | POA: Diagnosis not present

## 2022-10-08 DIAGNOSIS — Z419 Encounter for procedure for purposes other than remedying health state, unspecified: Secondary | ICD-10-CM | POA: Diagnosis not present

## 2022-10-18 DIAGNOSIS — J329 Chronic sinusitis, unspecified: Secondary | ICD-10-CM | POA: Diagnosis not present

## 2022-10-18 DIAGNOSIS — J40 Bronchitis, not specified as acute or chronic: Secondary | ICD-10-CM | POA: Diagnosis not present

## 2022-11-01 DIAGNOSIS — S161XXA Strain of muscle, fascia and tendon at neck level, initial encounter: Secondary | ICD-10-CM | POA: Diagnosis not present

## 2022-11-01 DIAGNOSIS — Z9889 Other specified postprocedural states: Secondary | ICD-10-CM | POA: Diagnosis not present

## 2022-11-01 DIAGNOSIS — F331 Major depressive disorder, recurrent, moderate: Secondary | ICD-10-CM | POA: Diagnosis not present

## 2022-11-01 DIAGNOSIS — M549 Dorsalgia, unspecified: Secondary | ICD-10-CM | POA: Diagnosis not present

## 2022-11-08 DIAGNOSIS — Z419 Encounter for procedure for purposes other than remedying health state, unspecified: Secondary | ICD-10-CM | POA: Diagnosis not present

## 2022-11-27 DIAGNOSIS — J40 Bronchitis, not specified as acute or chronic: Secondary | ICD-10-CM | POA: Diagnosis not present

## 2022-11-27 DIAGNOSIS — Z87891 Personal history of nicotine dependence: Secondary | ICD-10-CM | POA: Diagnosis not present

## 2022-12-08 DIAGNOSIS — Z419 Encounter for procedure for purposes other than remedying health state, unspecified: Secondary | ICD-10-CM | POA: Diagnosis not present

## 2022-12-18 DIAGNOSIS — F3111 Bipolar disorder, current episode manic without psychotic features, mild: Secondary | ICD-10-CM | POA: Diagnosis not present

## 2022-12-18 DIAGNOSIS — F3110 Bipolar disorder, current episode manic without psychotic features, unspecified: Secondary | ICD-10-CM | POA: Diagnosis not present

## 2022-12-18 DIAGNOSIS — F209 Schizophrenia, unspecified: Secondary | ICD-10-CM | POA: Diagnosis not present

## 2023-01-08 DIAGNOSIS — Z419 Encounter for procedure for purposes other than remedying health state, unspecified: Secondary | ICD-10-CM | POA: Diagnosis not present

## 2023-01-19 DIAGNOSIS — Z419 Encounter for procedure for purposes other than remedying health state, unspecified: Secondary | ICD-10-CM | POA: Diagnosis not present

## 2023-01-20 DIAGNOSIS — F3110 Bipolar disorder, current episode manic without psychotic features, unspecified: Secondary | ICD-10-CM | POA: Diagnosis not present

## 2023-01-20 DIAGNOSIS — F418 Other specified anxiety disorders: Secondary | ICD-10-CM | POA: Diagnosis not present

## 2023-01-20 DIAGNOSIS — M545 Low back pain, unspecified: Secondary | ICD-10-CM | POA: Diagnosis not present

## 2023-01-20 DIAGNOSIS — S161XXA Strain of muscle, fascia and tendon at neck level, initial encounter: Secondary | ICD-10-CM | POA: Diagnosis not present

## 2023-01-20 DIAGNOSIS — G8929 Other chronic pain: Secondary | ICD-10-CM | POA: Diagnosis not present

## 2023-01-20 DIAGNOSIS — F3111 Bipolar disorder, current episode manic without psychotic features, mild: Secondary | ICD-10-CM | POA: Diagnosis not present

## 2023-02-08 DIAGNOSIS — Z419 Encounter for procedure for purposes other than remedying health state, unspecified: Secondary | ICD-10-CM | POA: Diagnosis not present

## 2023-03-08 DIAGNOSIS — Z419 Encounter for procedure for purposes other than remedying health state, unspecified: Secondary | ICD-10-CM | POA: Diagnosis not present

## 2023-03-14 DIAGNOSIS — K0889 Other specified disorders of teeth and supporting structures: Secondary | ICD-10-CM | POA: Diagnosis not present

## 2023-03-14 DIAGNOSIS — M549 Dorsalgia, unspecified: Secondary | ICD-10-CM | POA: Diagnosis not present

## 2023-03-14 DIAGNOSIS — G8929 Other chronic pain: Secondary | ICD-10-CM | POA: Diagnosis not present

## 2023-03-14 DIAGNOSIS — K047 Periapical abscess without sinus: Secondary | ICD-10-CM | POA: Diagnosis not present

## 2023-03-14 DIAGNOSIS — K029 Dental caries, unspecified: Secondary | ICD-10-CM | POA: Diagnosis not present

## 2023-04-19 DIAGNOSIS — Z419 Encounter for procedure for purposes other than remedying health state, unspecified: Secondary | ICD-10-CM | POA: Diagnosis not present

## 2023-05-19 DIAGNOSIS — Z419 Encounter for procedure for purposes other than remedying health state, unspecified: Secondary | ICD-10-CM | POA: Diagnosis not present

## 2023-05-22 DIAGNOSIS — S4991XA Unspecified injury of right shoulder and upper arm, initial encounter: Secondary | ICD-10-CM | POA: Diagnosis not present

## 2023-05-22 DIAGNOSIS — S46811D Strain of other muscles, fascia and tendons at shoulder and upper arm level, right arm, subsequent encounter: Secondary | ICD-10-CM | POA: Diagnosis not present

## 2023-06-19 DIAGNOSIS — Z419 Encounter for procedure for purposes other than remedying health state, unspecified: Secondary | ICD-10-CM | POA: Diagnosis not present

## 2023-07-19 DIAGNOSIS — Z419 Encounter for procedure for purposes other than remedying health state, unspecified: Secondary | ICD-10-CM | POA: Diagnosis not present

## 2023-07-28 DIAGNOSIS — Z3200 Encounter for pregnancy test, result unknown: Secondary | ICD-10-CM | POA: Diagnosis not present

## 2023-07-28 DIAGNOSIS — M25562 Pain in left knee: Secondary | ICD-10-CM | POA: Diagnosis not present

## 2023-07-28 DIAGNOSIS — M545 Low back pain, unspecified: Secondary | ICD-10-CM | POA: Diagnosis not present

## 2023-08-19 DIAGNOSIS — Z419 Encounter for procedure for purposes other than remedying health state, unspecified: Secondary | ICD-10-CM | POA: Diagnosis not present

## 2023-09-03 DIAGNOSIS — K0889 Other specified disorders of teeth and supporting structures: Secondary | ICD-10-CM | POA: Diagnosis not present

## 2023-09-03 DIAGNOSIS — Z9079 Acquired absence of other genital organ(s): Secondary | ICD-10-CM | POA: Diagnosis not present

## 2023-09-03 DIAGNOSIS — Z8742 Personal history of other diseases of the female genital tract: Secondary | ICD-10-CM | POA: Diagnosis not present

## 2023-09-03 DIAGNOSIS — Z7689 Persons encountering health services in other specified circumstances: Secondary | ICD-10-CM | POA: Diagnosis not present

## 2023-09-19 DIAGNOSIS — Z419 Encounter for procedure for purposes other than remedying health state, unspecified: Secondary | ICD-10-CM | POA: Diagnosis not present

## 2023-10-19 DIAGNOSIS — Z419 Encounter for procedure for purposes other than remedying health state, unspecified: Secondary | ICD-10-CM | POA: Diagnosis not present

## 2023-11-19 DIAGNOSIS — Z419 Encounter for procedure for purposes other than remedying health state, unspecified: Secondary | ICD-10-CM | POA: Diagnosis not present
